# Patient Record
Sex: Female | Born: 1945 | Race: White | Hispanic: No | Marital: Married | Smoking: Current every day smoker
Health system: Southern US, Community
[De-identification: ages and names within clinical notes are randomized; demographics above are authoritative.]

## PROBLEM LIST (undated history)

## (undated) DIAGNOSIS — K219 Gastro-esophageal reflux disease without esophagitis: Secondary | ICD-10-CM

## (undated) DIAGNOSIS — E119 Type 2 diabetes mellitus without complications: Secondary | ICD-10-CM

## (undated) DIAGNOSIS — N189 Chronic kidney disease, unspecified: Secondary | ICD-10-CM

## (undated) HISTORY — PX: TONSILLECTOMY: SUR1361

## (undated) HISTORY — PX: APPENDECTOMY: SHX54

## (undated) HISTORY — PX: ABDOMINAL HYSTERECTOMY: SHX81

## (undated) HISTORY — PX: BACK SURGERY: SHX140

## (undated) HISTORY — PX: BREAST SURGERY: SHX581

## (undated) HISTORY — PX: EYE SURGERY: SHX253

---

## 2015-03-17 ENCOUNTER — Other Ambulatory Visit (HOSPITAL_BASED_OUTPATIENT_CLINIC_OR_DEPARTMENT_OTHER): Payer: Self-pay | Admitting: Neurosurgery

## 2015-03-17 DIAGNOSIS — M5416 Radiculopathy, lumbar region: Secondary | ICD-10-CM

## 2015-03-22 ENCOUNTER — Ambulatory Visit (HOSPITAL_BASED_OUTPATIENT_CLINIC_OR_DEPARTMENT_OTHER)
Admission: RE | Admit: 2015-03-22 | Discharge: 2015-03-22 | Disposition: A | Discharge status: Home / Self Care | Payer: Medicare Other | Source: Ambulatory Visit | Attending: Neurosurgery | Admitting: Neurosurgery

## 2015-03-22 DIAGNOSIS — M5124 Other intervertebral disc displacement, thoracic region: Secondary | ICD-10-CM | POA: Insufficient documentation

## 2015-03-22 DIAGNOSIS — M5116 Intervertebral disc disorders with radiculopathy, lumbar region: Secondary | ICD-10-CM | POA: Diagnosis not present

## 2015-03-22 DIAGNOSIS — M545 Low back pain: Secondary | ICD-10-CM | POA: Diagnosis present

## 2015-03-22 DIAGNOSIS — M4687 Other specified inflammatory spondylopathies, lumbosacral region: Secondary | ICD-10-CM | POA: Diagnosis not present

## 2015-03-22 DIAGNOSIS — M5416 Radiculopathy, lumbar region: Secondary | ICD-10-CM

## 2016-04-28 ENCOUNTER — Other Ambulatory Visit (HOSPITAL_BASED_OUTPATIENT_CLINIC_OR_DEPARTMENT_OTHER): Payer: Self-pay | Admitting: Neurological Surgery

## 2016-04-28 DIAGNOSIS — M5414 Radiculopathy, thoracic region: Secondary | ICD-10-CM

## 2016-05-01 ENCOUNTER — Ambulatory Visit (HOSPITAL_BASED_OUTPATIENT_CLINIC_OR_DEPARTMENT_OTHER)
Admission: RE | Admit: 2016-05-01 | Discharge: 2016-05-01 | Disposition: A | Discharge status: Home / Self Care | Payer: Medicare Other | Source: Ambulatory Visit | Attending: Neurological Surgery | Admitting: Neurological Surgery

## 2016-05-01 DIAGNOSIS — M5124 Other intervertebral disc displacement, thoracic region: Secondary | ICD-10-CM | POA: Diagnosis not present

## 2016-05-01 DIAGNOSIS — M5414 Radiculopathy, thoracic region: Secondary | ICD-10-CM | POA: Insufficient documentation

## 2020-11-28 ENCOUNTER — Other Ambulatory Visit: Payer: Self-pay | Admitting: Neurological Surgery

## 2020-12-17 NOTE — Progress Notes (Signed)
Surgical Instructions    Your procedure is scheduled on 12/22/20.  Report to [facility] Main Entrance "A" at 11:30 A.M., then check in with the Admitting office.  Call this number if you have problems the morning of surgery:  [phone]   If you have any questions prior to your surgery date call [phone]: Open Monday-Friday 8am-4pm    Remember:  Do not eat or drink after midnight the night before your surgery   Take these medicines the morning of surgery with A SIP OF WATER  pravastatin (PRAVACHOL)  If needed: brimonidine-timolol (COMBIGAN) meclizine (ANTIVERT)  As of today, STOP taking any Aspirin (unless otherwise instructed by your surgeon) Aleve, Naproxen, Ibuprofen, Motrin, Advil, Goody's, BC's, all herbal medications, fish oil, and all vitamins.  WHAT DO I DO ABOUT MY DIABETES MEDICATION? Take glipiZIDE (GLUCOTROL XL) only in the morning the day before surgery.  Take metFORMIN (GLUCOPHAGE-XR) usual dose the day before surgery Do not take glipiZIDE (GLUCOTROL XL) or metFORMIN (GLUCOPHAGE-XR) the morning of surgery.      HOW TO MANAGE YOUR DIABETES BEFORE AND AFTER SURGERY  Why is it important to control my blood sugar before and after surgery? Improving blood sugar levels before and after surgery helps healing and can limit problems. A way of improving blood sugar control is eating a healthy diet by:  Eating less sugar and carbohydrates  Increasing activity/exercise  Talking with your doctor about reaching your blood sugar goals High blood sugars (greater than 180 mg/dL) can raise your risk of infections and slow your recovery, so you will need to focus on controlling your diabetes during the weeks before surgery. Make sure that the doctor who takes care of your diabetes knows about your planned surgery including the date and location.  How do I manage my blood sugar before surgery? Check your blood sugar at least 4 times a day, starting 2 days before surgery, to  make sure that the level is not too high or low.  Check your blood sugar the morning of your surgery when you wake up and every 2 hours until you get to the Short Stay unit.  If your blood sugar is less than 70 mg/dL, you will need to treat for low blood sugar: Do not take insulin. Treat a low blood sugar (less than 70 mg/dL) with  cup of clear juice (cranberry or apple), 4 glucose tablets, OR glucose gel. Recheck blood sugar in 15 minutes after treatment (to make sure it is greater than 70 mg/dL). If your blood sugar is not greater than 70 mg/dL on recheck, call [phone] for further instructions. Report your blood sugar to the short stay nurse when you get to Short Stay.  If you are admitted to the hospital after surgery: Your blood sugar will be checked by the staff and you will probably be given insulin after surgery (instead of oral diabetes medicines) to make sure you have good blood sugar levels. The goal for blood sugar control after surgery is 80-180 mg/dL.           Do not wear jewelry or makeup Do not wear lotions, powders, perfumes/colognes, or deodorant. Do not shave 48 hours prior to surgery.  Men may shave face and neck. Do not bring valuables to the hospital. DO Not wear nail polish, gel polish, artificial nails, or any other type of covering on  natural nails including finger and toenails. If patients have artificial nails, gel coating, etc. that need to be removed by a nail  salon please have this removed prior to surgery or surgery may need to be canceled/delayed if the surgeon/ anesthesia feels like the patient is unable to be adequately monitored.             [facility] is not responsible for any belongings or valuables.  Do NOT Smoke (Tobacco/Vaping) or drink Alcohol 24 hours prior to your procedure If you use a CPAP at night, you may bring all equipment for your overnight stay.   Contacts, glasses, dentures or bridgework may not be worn into surgery, please bring  cases for these belongings   For patients admitted to the hospital, discharge time will be determined by your treatment team.   Patients discharged the day of surgery will not be allowed to drive home, and someone needs to stay with them for 24 hours.  ONLY 1 SUPPORT PERSON MAY BE PRESENT WHILE YOU ARE IN SURGERY. IF YOU ARE TO BE ADMITTED ONCE YOU ARE IN YOUR ROOM YOU WILL BE ALLOWED TWO (2) VISITORS.  Minor children may have two parents present. Special consideration for safety and communication needs will be reviewed on a case by case basis.  Special instructions:    Oral Hygiene is also important to reduce your risk of infection.  Remember - BRUSH YOUR TEETH THE MORNING OF SURGERY WITH YOUR REGULAR TOOTHPASTE   [facility]- Preparing For Surgery  Before surgery, you can play an important role. Because skin is not sterile, your skin needs to be as free of germs as possible. You can reduce the number of germs on your skin by washing with CHG (chlorahexidine gluconate) Soap before surgery.  CHG is an antiseptic cleaner which kills germs and bonds with the skin to continue killing germs even after washing.     Please do not use if you have an allergy to CHG or antibacterial soaps. If your skin becomes reddened/irritated stop using the CHG.  Do not shave (including legs and underarms) for at least 48 hours prior to first CHG shower. It is OK to shave your face.  Please follow these instructions carefully.     Shower the NIGHT BEFORE SURGERY and the MORNING OF SURGERY with CHG Soap.   If you chose to wash your hair, wash your hair first as usual with your normal shampoo. After you shampoo, rinse your hair and body thoroughly to remove the shampoo.  Then [employer] and genitals (private parts) with your normal soap and rinse thoroughly to remove soap.  After that Use CHG Soap as you would any other liquid soap. You can apply CHG directly to the skin and wash gently with a scrungie or a clean  washcloth.   Apply the CHG Soap to your body ONLY FROM THE NECK DOWN.  Do not use on open wounds or open sores. Avoid contact with your eyes, ears, mouth and genitals (private parts). Wash Face and genitals (private parts)  with your normal soap.   Wash thoroughly, paying special attention to the area where your surgery will be performed.  Thoroughly rinse your body with warm water from the neck down.  DO NOT shower/wash with your normal soap after using and rinsing off the CHG Soap.  Pat yourself dry with a CLEAN TOWEL.  Wear CLEAN PAJAMAS to bed the night before surgery  Place CLEAN SHEETS on your bed the night before your surgery  DO NOT SLEEP WITH PETS.   Day of Surgery:  Take a shower with CHG soap. Wear Clean/Comfortable clothing  the morning of surgery Do not apply any deodorants/lotions.   Remember to brush your teeth WITH YOUR REGULAR TOOTHPASTE.   Please read over the following fact sheets that you were given.

## 2020-12-17 NOTE — Progress Notes (Signed)
Surgical Instructions    Your procedure is scheduled on 12/22/20.  Report to [facility] Main Entrance "A" at 11:30 A.M., then check in with the Admitting office.  Call this number if you have problems the morning of surgery:  [phone]   If you have any questions prior to your surgery date call [phone]: Open Monday-Friday 8am-4pm    Remember:  Do not eat or drink after midnight the night before your surgery   Take these medicines the morning of surgery with A SIP OF WATER  pravastatin (PRAVACHOL) brimonidine-timolol (COMBIGAN)   If needed: meclizine (ANTIVERT) oratadine-pseudoephedrine (CLARITIN-D 12-HOUR)   As of today, STOP taking any Aspirin (unless otherwise instructed by your surgeon) Aleve, Naproxen, Ibuprofen, Motrin, Advil, Goody's, BC's, all herbal medications, fish oil, and all vitamins.  WHAT DO I DO ABOUT MY DIABETES MEDICATION? Do not take glipiZIDE (GLUCOTROL XL) or metFORMIN (GLUCOPHAGE-XR) the morning of surgery.      HOW TO MANAGE YOUR DIABETES BEFORE AND AFTER SURGERY  Why is it important to control my blood sugar before and after surgery? Improving blood sugar levels before and after surgery helps healing and can limit problems. A way of improving blood sugar control is eating a healthy diet by:  Eating less sugar and carbohydrates  Increasing activity/exercise  Talking with your doctor about reaching your blood sugar goals High blood sugars (greater than 180 mg/dL) can raise your risk of infections and slow your recovery, so you will need to focus on controlling your diabetes during the weeks before surgery. Make sure that the doctor who takes care of your diabetes knows about your planned surgery including the date and location.  How do I manage my blood sugar before surgery? Check your blood sugar at least 4 times a day, starting 2 days before surgery, to make sure that the level is not too high or low.  Check your blood sugar the morning of  your surgery when you wake up and every 2 hours until you get to the Short Stay unit.  If your blood sugar is less than 70 mg/dL, you will need to treat for low blood sugar: Do not take insulin. Treat a low blood sugar (less than 70 mg/dL) with  cup of clear juice (cranberry or apple), 4 glucose tablets, OR glucose gel. Recheck blood sugar in 15 minutes after treatment (to make sure it is greater than 70 mg/dL). If your blood sugar is not greater than 70 mg/dL on recheck, call [phone] for further instructions. Report your blood sugar to the short stay nurse when you get to Short Stay.  If you are admitted to the hospital after surgery: Your blood sugar will be checked by the staff and you will probably be given insulin after surgery (instead of oral diabetes medicines) to make sure you have good blood sugar levels. The goal for blood sugar control after surgery is 80-180 mg/dL.           Do not wear jewelry or makeup Do not wear lotions, powders, perfumes/colognes, or deodorant. Do not shave 48 hours prior to surgery.  Men may shave face and neck. Do not bring valuables to the hospital. DO Not wear nail polish, gel polish, artificial nails, or any other type of covering on  natural nails including finger and toenails. If patients have artificial nails, gel coating, etc. that need to be removed by a nail salon please have this removed prior to surgery or surgery may need to be canceled/delayed if the  surgeon/ anesthesia feels like the patient is unable to be adequately monitored.             [facility] is not responsible for any belongings or valuables.  Do NOT Smoke (Tobacco/Vaping) or drink Alcohol 24 hours prior to your procedure If you use a CPAP at night, you may bring all equipment for your overnight stay.   Contacts, glasses, dentures or bridgework may not be worn into surgery, please bring cases for these belongings   For patients admitted to the hospital, discharge time will  be determined by your treatment team.   Patients discharged the day of surgery will not be allowed to drive home, and someone needs to stay with them for 24 hours.  ONLY 1 SUPPORT PERSON MAY BE PRESENT WHILE YOU ARE IN SURGERY. IF YOU ARE TO BE ADMITTED ONCE YOU ARE IN YOUR ROOM YOU WILL BE ALLOWED TWO (2) VISITORS.  Minor children may have two parents present. Special consideration for safety and communication needs will be reviewed on a case by case basis.  Special instructions:    Oral Hygiene is also important to reduce your risk of infection.  Remember - BRUSH YOUR TEETH THE MORNING OF SURGERY WITH YOUR REGULAR TOOTHPASTE   [facility]- Preparing For Surgery  Before surgery, you can play an important role. Because skin is not sterile, your skin needs to be as free of germs as possible. You can reduce the number of germs on your skin by washing with CHG (chlorahexidine gluconate) Soap before surgery.  CHG is an antiseptic cleaner which kills germs and bonds with the skin to continue killing germs even after washing.     Please do not use if you have an allergy to CHG or antibacterial soaps. If your skin becomes reddened/irritated stop using the CHG.  Do not shave (including legs and underarms) for at least 48 hours prior to first CHG shower. It is OK to shave your face.  Please follow these instructions carefully.     Shower the NIGHT BEFORE SURGERY and the MORNING OF SURGERY with CHG Soap.   If you chose to wash your hair, wash your hair first as usual with your normal shampoo. After you shampoo, rinse your hair and body thoroughly to remove the shampoo.  Then [employer] and genitals (private parts) with your normal soap and rinse thoroughly to remove soap.  After that Use CHG Soap as you would any other liquid soap. You can apply CHG directly to the skin and wash gently with a scrungie or a clean washcloth.   Apply the CHG Soap to your body ONLY FROM THE NECK DOWN.  Do not use on open  wounds or open sores. Avoid contact with your eyes, ears, mouth and genitals (private parts). Wash Face and genitals (private parts)  with your normal soap.   Wash thoroughly, paying special attention to the area where your surgery will be performed.  Thoroughly rinse your body with warm water from the neck down.  DO NOT shower/wash with your normal soap after using and rinsing off the CHG Soap.  Pat yourself dry with a CLEAN TOWEL.  Wear CLEAN PAJAMAS to bed the night before surgery  Place CLEAN SHEETS on your bed the night before your surgery  DO NOT SLEEP WITH PETS.   Day of Surgery: Take a shower with CHG soap. Wear Clean/Comfortable clothing the morning of surgery Do not apply any deodorants/lotions.   Remember to brush your teeth WITH YOUR  REGULAR TOOTHPASTE.   Please read over the following fact sheets that you were given.

## 2020-12-18 ENCOUNTER — Encounter (HOSPITAL_COMMUNITY)
Admission: RE | Admit: 2020-12-18 | Discharge: 2020-12-18 | Disposition: A | Discharge status: Home / Self Care | Payer: Medicare Other | Source: Ambulatory Visit | Attending: Neurological Surgery | Admitting: Neurological Surgery

## 2020-12-18 ENCOUNTER — Encounter (HOSPITAL_COMMUNITY): Payer: Self-pay

## 2020-12-18 ENCOUNTER — Other Ambulatory Visit: Payer: Self-pay

## 2020-12-18 DIAGNOSIS — Z20822 Contact with and (suspected) exposure to covid-19: Secondary | ICD-10-CM | POA: Diagnosis not present

## 2020-12-18 DIAGNOSIS — Z01812 Encounter for preprocedural laboratory examination: Secondary | ICD-10-CM | POA: Insufficient documentation

## 2020-12-18 HISTORY — DX: Type 2 diabetes mellitus without complications: E11.9

## 2020-12-18 HISTORY — DX: Chronic kidney disease, unspecified: N18.9

## 2020-12-18 HISTORY — DX: Gastro-esophageal reflux disease without esophagitis: K21.9

## 2020-12-18 LAB — TYPE AND SCREEN
ABO/RH(D): A POS
Antibody Screen: NEGATIVE

## 2020-12-18 LAB — CBC
HCT: 43.9 % (ref 36.0–46.0)
Hemoglobin: 14.5 g/dL (ref 12.0–15.0)
MCH: 30 pg (ref 26.0–34.0)
MCHC: 33 g/dL (ref 30.0–36.0)
MCV: 90.7 fL (ref 80.0–100.0)
Platelets: 357 10*3/uL (ref 150–400)
RBC: 4.84 MIL/uL (ref 3.87–5.11)
RDW: 14.7 % (ref 11.5–15.5)
WBC: 12.3 10*3/uL — ABNORMAL HIGH (ref 4.0–10.5)
nRBC: 0 % (ref 0.0–0.2)

## 2020-12-18 LAB — SURGICAL PCR SCREEN
MRSA, PCR: NEGATIVE
Staphylococcus aureus: NEGATIVE

## 2020-12-18 LAB — BASIC METABOLIC PANEL
Anion gap: 9 (ref 5–15)
BUN: 12 mg/dL (ref 8–23)
CO2: 24 mmol/L (ref 22–32)
Calcium: 9.6 mg/dL (ref 8.9–10.3)
Chloride: 105 mmol/L (ref 98–111)
Creatinine, Ser: 0.55 mg/dL (ref 0.44–1.00)
GFR, Estimated: >60 mL/min (ref >60)
Glucose, Bld: 86 mg/dL (ref 70–99)
Potassium: 3.7 mmol/L (ref 3.5–5.1)
Sodium: 138 mmol/L (ref 135–145)

## 2020-12-18 LAB — SARS CORONAVIRUS 2 (TAT 6-24 HRS): SARS Coronavirus 2: NEGATIVE

## 2020-12-18 LAB — HEMOGLOBIN A1C
Hgb A1c MFr Bld: 7.5 % — ABNORMAL HIGH (ref 4.8–5.6)
Mean Plasma Glucose: 168.55 mg/dL

## 2020-12-18 LAB — GLUCOSE, CAPILLARY: Glucose-Capillary: 88 mg/dL (ref 70–99)

## 2020-12-18 NOTE — Progress Notes (Signed)
PCP - Elijah Birk, DO Cardiologist - denies  PPM/ICD - denies Device Orders - N/A Rep Notified - N/A  Chest x-ray - N/A EKG - 12/29/2019 (EKG tracing requested) Stress Test - denies ECHO - denies Cardiac Cath - denies  Sleep Study - denies CPAP - N/A  Fasting Blood Sugar - 106/119 Checks Blood Sugar 2 times a day CBG today - 88 A1C done in PAT on 12/18/2020  Blood Thinner Instructions: N/A Aspirin Instructions: Aspirin - last dose - 12/14/2020  Patient was instructed: As of today, STOP taking any Aspirin (unless otherwise instructed by your surgeon) Aleve, Naproxen, Ibuprofen, Motrin, Advil, Goody's, BC's, all herbal medications, fish oil, and all vitamins.  ERAS Protcol - No PRE-SURGERY Ensure or G2- N/A  COVID TEST- 12/18/2020   Anesthesia review: yes, EKG tracing requested in PAT  Patient denies shortness of breath, fever, cough and chest pain at PAT appointment   All instructions explained to the patient, with a verbal understanding of the material. Patient agrees to go over the instructions while at home for a better understanding. Patient also instructed to self quarantine after being tested for COVID-19. The opportunity to ask questions was provided.

## 2020-12-18 NOTE — Progress Notes (Signed)
Dr. Marikay Alar was notified that this patient is coming for PAT appointment on 12/18/2020 @ 3 PM and pre-op orders were not available.

## 2020-12-22 ENCOUNTER — Encounter (HOSPITAL_COMMUNITY): Payer: Self-pay | Admitting: Neurological Surgery

## 2020-12-22 ENCOUNTER — Other Ambulatory Visit: Payer: Self-pay

## 2020-12-22 ENCOUNTER — Ambulatory Visit (HOSPITAL_COMMUNITY): Payer: Medicare Other | Admitting: Physician Assistant

## 2020-12-22 ENCOUNTER — Observation Stay (HOSPITAL_COMMUNITY)
Admission: RE | Admit: 2020-12-22 | Discharge: 2020-12-24 | Disposition: A | Discharge status: Home / Self Care | Payer: Medicare Other | Source: Ambulatory Visit | Attending: Neurological Surgery | Admitting: Neurological Surgery

## 2020-12-22 ENCOUNTER — Encounter (HOSPITAL_COMMUNITY)
Admission: RE | Disposition: A | Discharge status: Home / Self Care | Payer: Self-pay | Source: Ambulatory Visit | Attending: Neurological Surgery

## 2020-12-22 ENCOUNTER — Ambulatory Visit (HOSPITAL_COMMUNITY): Payer: Medicare Other

## 2020-12-22 ENCOUNTER — Ambulatory Visit (HOSPITAL_COMMUNITY): Payer: Medicare Other | Admitting: Anesthesiology

## 2020-12-22 DIAGNOSIS — Z91040 Latex allergy status: Secondary | ICD-10-CM | POA: Diagnosis not present

## 2020-12-22 DIAGNOSIS — Z79899 Other long term (current) drug therapy: Secondary | ICD-10-CM | POA: Diagnosis not present

## 2020-12-22 DIAGNOSIS — Z7982 Long term (current) use of aspirin: Secondary | ICD-10-CM | POA: Diagnosis not present

## 2020-12-22 DIAGNOSIS — F17210 Nicotine dependence, cigarettes, uncomplicated: Secondary | ICD-10-CM | POA: Diagnosis not present

## 2020-12-22 DIAGNOSIS — E1122 Type 2 diabetes mellitus with diabetic chronic kidney disease: Secondary | ICD-10-CM | POA: Diagnosis not present

## 2020-12-22 DIAGNOSIS — M5116 Intervertebral disc disorders with radiculopathy, lumbar region: Secondary | ICD-10-CM | POA: Diagnosis not present

## 2020-12-22 DIAGNOSIS — N189 Chronic kidney disease, unspecified: Secondary | ICD-10-CM | POA: Insufficient documentation

## 2020-12-22 DIAGNOSIS — Z7984 Long term (current) use of oral hypoglycemic drugs: Secondary | ICD-10-CM | POA: Diagnosis not present

## 2020-12-22 DIAGNOSIS — Z981 Arthrodesis status: Secondary | ICD-10-CM

## 2020-12-22 DIAGNOSIS — M5126 Other intervertebral disc displacement, lumbar region: Secondary | ICD-10-CM | POA: Diagnosis present

## 2020-12-22 DIAGNOSIS — Z419 Encounter for procedure for purposes other than remedying health state, unspecified: Secondary | ICD-10-CM

## 2020-12-22 LAB — GLUCOSE, CAPILLARY
Glucose-Capillary: 127 mg/dL — ABNORMAL HIGH (ref 70–99)
Glucose-Capillary: 104 mg/dL — ABNORMAL HIGH (ref 70–99)
Glucose-Capillary: 332 mg/dL — ABNORMAL HIGH (ref 70–99)

## 2020-12-22 LAB — ABO/RH: ABO/RH(D): A POS

## 2020-12-22 SURGERY — POSTERIOR LUMBAR FUSION 1 LEVEL
Anesthesia: General | Site: Back

## 2020-12-22 MED ORDER — METHOCARBAMOL 500 MG PO TABS
500.0000 mg | ORAL_TABLET | Freq: Four times a day (QID) | ORAL | Status: DC | PRN
  Administered 2020-12-22 – 2020-12-24 (×5): 500 mg via ORAL
  Filled 2020-12-22 (×4): qty 1

## 2020-12-22 MED ORDER — DIPHENHYDRAMINE HCL 50 MG/ML IJ SOLN
INTRAMUSCULAR | Status: DC | PRN
  Administered 2020-12-22: 12.5 mg via INTRAVENOUS

## 2020-12-22 MED ORDER — THROMBIN 5000 UNITS EX SOLR
OROMUCOSAL | Status: DC | PRN
  Administered 2020-12-22: 5 mL via TOPICAL

## 2020-12-22 MED ORDER — THROMBIN 20000 UNITS EX SOLR
CUTANEOUS | Status: DC | PRN
  Administered 2020-12-22: 20 mL via TOPICAL

## 2020-12-22 MED ORDER — SODIUM CHLORIDE 0.9 % IV SOLN
250.0000 mL | INTRAVENOUS | Status: DC
  Administered 2020-12-22: 250 mL via INTRAVENOUS

## 2020-12-22 MED ORDER — ONDANSETRON HCL 4 MG/2ML IJ SOLN: 4.0000 mg | Freq: Once | INTRAMUSCULAR | Status: DC | PRN

## 2020-12-22 MED ORDER — OXYCODONE HCL 5 MG PO TABS
10.0000 mg | ORAL_TABLET | ORAL | Status: DC | PRN
  Administered 2020-12-22 – 2020-12-23 (×3): 10 mg via ORAL
  Filled 2020-12-22 (×2): qty 2

## 2020-12-22 MED ORDER — INSULIN ASPART 100 UNIT/ML IJ SOLN
0.0000 [IU] | Freq: Three times a day (TID) | INTRAMUSCULAR | Status: DC
  Administered 2020-12-23 (×2): 3 [IU] via SUBCUTANEOUS
  Administered 2020-12-24: 2 [IU] via SUBCUTANEOUS

## 2020-12-22 MED ORDER — INSULIN ASPART 100 UNIT/ML IJ SOLN
0.0000 [IU] | Freq: Three times a day (TID) | INTRAMUSCULAR | Status: DC
Start: 2020-12-23 — End: 2020-12-22

## 2020-12-22 MED ORDER — PHENOL 1.4 % MT LIQD: 1.0000 | OROMUCOSAL | Status: DC | PRN

## 2020-12-22 MED ORDER — METFORMIN HCL ER 500 MG PO TB24
500.0000 mg | ORAL_TABLET | Freq: Two times a day (BID) | ORAL | Status: DC
  Administered 2020-12-22 – 2020-12-24 (×4): 500 mg via ORAL
  Filled 2020-12-22 (×4): qty 1

## 2020-12-22 MED ORDER — MENTHOL 3 MG MT LOZG: 1.0000 | LOZENGE | OROMUCOSAL | Status: DC | PRN

## 2020-12-22 MED ORDER — THROMBIN 5000 UNITS EX SOLR
CUTANEOUS | Status: AC
  Filled 2020-12-22: qty 5000

## 2020-12-22 MED ORDER — POTASSIUM CHLORIDE IN NACL 20-0.9 MEQ/L-% IV SOLN: INTRAVENOUS | Status: DC

## 2020-12-22 MED ORDER — ONDANSETRON HCL 4 MG/2ML IJ SOLN: 4.0000 mg | Freq: Four times a day (QID) | INTRAMUSCULAR | Status: DC | PRN

## 2020-12-22 MED ORDER — ONDANSETRON HCL 4 MG PO TABS: 4.0000 mg | ORAL_TABLET | Freq: Four times a day (QID) | ORAL | Status: DC | PRN

## 2020-12-22 MED ORDER — ACETAMINOPHEN 500 MG PO TABS
1000.0000 mg | ORAL_TABLET | Freq: Once | ORAL | Status: AC
  Administered 2020-12-22: 1000 mg via ORAL
  Filled 2020-12-22: qty 2

## 2020-12-22 MED ORDER — FENTANYL CITRATE (PF) 100 MCG/2ML IJ SOLN
INTRAMUSCULAR | Status: AC
  Filled 2020-12-22: qty 2

## 2020-12-22 MED ORDER — SENNA 8.6 MG PO TABS
1.0000 | ORAL_TABLET | Freq: Two times a day (BID) | ORAL | Status: DC
  Administered 2020-12-22 – 2020-12-24 (×4): 8.6 mg via ORAL
  Filled 2020-12-22 (×4): qty 1

## 2020-12-22 MED ORDER — LACTATED RINGERS IV SOLN: INTRAVENOUS | Status: DC

## 2020-12-22 MED ORDER — SODIUM CHLORIDE 0.9% FLUSH: 3.0000 mL | INTRAVENOUS | Status: DC | PRN

## 2020-12-22 MED ORDER — FENTANYL CITRATE (PF) 250 MCG/5ML IJ SOLN
INTRAMUSCULAR | Status: AC
  Filled 2020-12-22: qty 5

## 2020-12-22 MED ORDER — TIMOLOL MALEATE 0.5 % OP SOLN
1.0000 [drp] | Freq: Two times a day (BID) | OPHTHALMIC | Status: DC
  Administered 2020-12-22 – 2020-12-24 (×4): 1 [drp] via OPHTHALMIC
  Filled 2020-12-22: qty 5

## 2020-12-22 MED ORDER — LIDOCAINE 2% (20 MG/ML) 5 ML SYRINGE
INTRAMUSCULAR | Status: DC | PRN
  Administered 2020-12-22: 60 mg via INTRAVENOUS

## 2020-12-22 MED ORDER — OXYCODONE HCL 5 MG PO TABS
ORAL_TABLET | ORAL | Status: AC
  Filled 2020-12-22: qty 2

## 2020-12-22 MED ORDER — VITAMIN D 25 MCG (1000 UNIT) PO TABS
1000.0000 [IU] | ORAL_TABLET | Freq: Every day | ORAL | Status: DC
  Administered 2020-12-23 – 2020-12-24 (×2): 1000 [IU] via ORAL
  Filled 2020-12-22 (×2): qty 1

## 2020-12-22 MED ORDER — ASPIRIN EC 81 MG PO TBEC
81.0000 mg | DELAYED_RELEASE_TABLET | Freq: Every day | ORAL | Status: DC
  Administered 2020-12-23 – 2020-12-24 (×2): 81 mg via ORAL
  Filled 2020-12-22 (×2): qty 1

## 2020-12-22 MED ORDER — GLIPIZIDE ER 10 MG PO TB24
20.0000 mg | ORAL_TABLET | Freq: Every day | ORAL | Status: DC
  Administered 2020-12-23 – 2020-12-24 (×2): 20 mg via ORAL
  Filled 2020-12-22 (×2): qty 2

## 2020-12-22 MED ORDER — CEFAZOLIN SODIUM-DEXTROSE 2-4 GM/100ML-% IV SOLN
2.0000 g | Freq: Three times a day (TID) | INTRAVENOUS | Status: AC
  Administered 2020-12-22 – 2020-12-23 (×2): 2 g via INTRAVENOUS
  Filled 2020-12-22 (×2): qty 100

## 2020-12-22 MED ORDER — CHOLECALCIFEROL 10 MCG (400 UNIT) PO TABS
400.0000 [IU] | ORAL_TABLET | Freq: Every day | ORAL | Status: DC
  Administered 2020-12-23 – 2020-12-24 (×2): 400 [IU] via ORAL
  Filled 2020-12-22 (×2): qty 1

## 2020-12-22 MED ORDER — 0.9 % SODIUM CHLORIDE (POUR BTL) OPTIME
TOPICAL | Status: DC | PRN
  Administered 2020-12-22: 1000 mL

## 2020-12-22 MED ORDER — ACETAMINOPHEN 650 MG RE SUPP: 650.0000 mg | RECTAL | Status: DC | PRN

## 2020-12-22 MED ORDER — ONDANSETRON HCL 4 MG/2ML IJ SOLN
INTRAMUSCULAR | Status: DC | PRN
  Administered 2020-12-22: 4 mg via INTRAVENOUS

## 2020-12-22 MED ORDER — FENTANYL CITRATE (PF) 100 MCG/2ML IJ SOLN
25.0000 ug | INTRAMUSCULAR | Status: DC | PRN
  Administered 2020-12-22 (×3): 50 ug via INTRAVENOUS

## 2020-12-22 MED ORDER — CELECOXIB 200 MG PO CAPS
200.0000 mg | ORAL_CAPSULE | Freq: Two times a day (BID) | ORAL | Status: DC
  Administered 2020-12-22 – 2020-12-24 (×4): 200 mg via ORAL
  Filled 2020-12-22 (×4): qty 1

## 2020-12-22 MED ORDER — CHLORHEXIDINE GLUCONATE 0.12 % MT SOLN
15.0000 mL | Freq: Once | OROMUCOSAL | Status: AC
  Administered 2020-12-22: 15 mL via OROMUCOSAL
  Filled 2020-12-22: qty 15

## 2020-12-22 MED ORDER — MORPHINE SULFATE (PF) 2 MG/ML IV SOLN: 2.0000 mg | INTRAVENOUS | Status: DC | PRN

## 2020-12-22 MED ORDER — CEFAZOLIN SODIUM-DEXTROSE 2-3 GM-%(50ML) IV SOLR
INTRAVENOUS | Status: DC | PRN
  Administered 2020-12-22: 2 g via INTRAVENOUS

## 2020-12-22 MED ORDER — ESTRADIOL 0.05 MG/24HR TD PTWK: 0.0500 mg | MEDICATED_PATCH | TRANSDERMAL | Status: DC

## 2020-12-22 MED ORDER — THROMBIN 20000 UNITS EX SOLR
CUTANEOUS | Status: AC
  Filled 2020-12-22: qty 20000

## 2020-12-22 MED ORDER — ROCURONIUM BROMIDE 10 MG/ML (PF) SYRINGE
PREFILLED_SYRINGE | INTRAVENOUS | Status: DC | PRN
  Administered 2020-12-22 (×2): 10 mg via INTRAVENOUS
  Administered 2020-12-22: 60 mg via INTRAVENOUS

## 2020-12-22 MED ORDER — ACETAMINOPHEN 325 MG PO TABS
650.0000 mg | ORAL_TABLET | ORAL | Status: DC | PRN
  Administered 2020-12-22 – 2020-12-23 (×3): 650 mg via ORAL
  Filled 2020-12-22 (×3): qty 2

## 2020-12-22 MED ORDER — BUPIVACAINE HCL (PF) 0.25 % IJ SOLN
INTRAMUSCULAR | Status: DC | PRN
  Administered 2020-12-22: 5 mL
  Administered 2020-12-22: 10 mL

## 2020-12-22 MED ORDER — BUPIVACAINE HCL (PF) 0.25 % IJ SOLN
INTRAMUSCULAR | Status: AC
  Filled 2020-12-22: qty 30

## 2020-12-22 MED ORDER — DEXAMETHASONE SODIUM PHOSPHATE 10 MG/ML IJ SOLN
INTRAMUSCULAR | Status: DC | PRN
  Administered 2020-12-22: 10 mg via INTRAVENOUS

## 2020-12-22 MED ORDER — PHENYLEPHRINE HCL-NACL 10-0.9 MG/250ML-% IV SOLN
INTRAVENOUS | Status: DC | PRN
  Administered 2020-12-22: 30 ug/min via INTRAVENOUS

## 2020-12-22 MED ORDER — INSULIN ASPART 100 UNIT/ML IJ SOLN
0.0000 [IU] | Freq: Every day | INTRAMUSCULAR | Status: DC
  Administered 2020-12-22: 4 [IU] via SUBCUTANEOUS

## 2020-12-22 MED ORDER — METHOCARBAMOL 500 MG PO TABS
ORAL_TABLET | ORAL | Status: AC
  Filled 2020-12-22: qty 1

## 2020-12-22 MED ORDER — SUGAMMADEX SODIUM 200 MG/2ML IV SOLN
INTRAVENOUS | Status: DC | PRN
  Administered 2020-12-22: 140 mg via INTRAVENOUS

## 2020-12-22 MED ORDER — METHOCARBAMOL 1000 MG/10ML IJ SOLN
500.0000 mg | Freq: Four times a day (QID) | INTRAVENOUS | Status: DC | PRN
  Filled 2020-12-22: qty 5

## 2020-12-22 MED ORDER — BRIMONIDINE TARTRATE 0.2 % OP SOLN
1.0000 [drp] | Freq: Two times a day (BID) | OPHTHALMIC | Status: DC
  Administered 2020-12-22 – 2020-12-24 (×4): 1 [drp] via OPHTHALMIC
  Filled 2020-12-22: qty 5

## 2020-12-22 MED ORDER — FENTANYL CITRATE (PF) 250 MCG/5ML IJ SOLN
INTRAMUSCULAR | Status: DC | PRN
  Administered 2020-12-22: 100 ug via INTRAVENOUS
  Administered 2020-12-22 (×3): 50 ug via INTRAVENOUS

## 2020-12-22 MED ORDER — SODIUM CHLORIDE 0.9% FLUSH
3.0000 mL | Freq: Two times a day (BID) | INTRAVENOUS | Status: DC
  Administered 2020-12-23: 3 mL via INTRAVENOUS

## 2020-12-22 MED ORDER — LATANOPROST 0.005 % OP SOLN
1.0000 [drp] | Freq: Every day | OPHTHALMIC | Status: DC
  Administered 2020-12-22 – 2020-12-23 (×2): 1 [drp] via OPHTHALMIC
  Filled 2020-12-22: qty 2.5

## 2020-12-22 MED ORDER — ORAL CARE MOUTH RINSE: 15.0000 mL | Freq: Once | OROMUCOSAL | Status: AC

## 2020-12-22 MED ORDER — PROPOFOL 10 MG/ML IV BOLUS
INTRAVENOUS | Status: DC | PRN
  Administered 2020-12-22: 100 mg via INTRAVENOUS

## 2020-12-22 MED ORDER — BRIMONIDINE TARTRATE-TIMOLOL 0.2-0.5 % OP SOLN: 1.0000 [drp] | Freq: Two times a day (BID) | OPHTHALMIC | Status: DC

## 2020-12-22 SURGICAL SUPPLY — 61 items
BAG COUNTER SPONGE SURGICOUNT (BAG) ×4 IMPLANT
BAG SURGICOUNT SPONGE COUNTING (BAG) ×2
BASKET BONE COLLECTION (BASKET) ×3 IMPLANT
BENZOIN TINCTURE PRP APPL 2/3 (GAUZE/BANDAGES/DRESSINGS) ×3 IMPLANT
BLADE CLIPPER SURG (BLADE) IMPLANT
BUR CARBIDE MATCH 3.0 (BURR) ×3 IMPLANT
CANISTER SUCT 3000ML PPV (MISCELLANEOUS) ×3 IMPLANT
CLOSURE WOUND 1/2 X4 (GAUZE/BANDAGES/DRESSINGS) ×1
CNTNR URN SCR LID CUP LEK RST (MISCELLANEOUS) ×1 IMPLANT
CONT SPEC 4OZ STRL OR WHT (MISCELLANEOUS) ×2
COVER BACK TABLE 60X90IN (DRAPES) ×3 IMPLANT
DERMABOND ADVANCED (GAUZE/BANDAGES/DRESSINGS) ×2
DERMABOND ADVANCED .7 DNX12 (GAUZE/BANDAGES/DRESSINGS) ×1 IMPLANT
DRAPE C-ARM 42X72 X-RAY (DRAPES) ×6 IMPLANT
DRAPE LAPAROTOMY 100X72X124 (DRAPES) ×3 IMPLANT
DRAPE SURG 17X23 STRL (DRAPES) ×3 IMPLANT
DRSG OPSITE POSTOP 4X6 (GAUZE/BANDAGES/DRESSINGS) ×3 IMPLANT
DURAPREP 26ML APPLICATOR (WOUND CARE) ×3 IMPLANT
ELECT REM PT RETURN 9FT ADLT (ELECTROSURGICAL) ×3
ELECTRODE REM PT RTRN 9FT ADLT (ELECTROSURGICAL) ×1 IMPLANT
EVACUATOR 1/8 PVC DRAIN (DRAIN) IMPLANT
FIBER BONE ALLOSYNC EXPAND 5 (Bone Implant) ×3 IMPLANT
GAUZE 4X4 16PLY ~~LOC~~+RFID DBL (SPONGE) ×3 IMPLANT
GLOVE SURG ENC MOIS LTX SZ7 (GLOVE) IMPLANT
GLOVE SURG ENC MOIS LTX SZ8 (GLOVE) ×6 IMPLANT
GLOVE SURG UNDER POLY LF SZ7 (GLOVE) IMPLANT
GOWN STRL REUS W/ TWL LRG LVL3 (GOWN DISPOSABLE) IMPLANT
GOWN STRL REUS W/ TWL XL LVL3 (GOWN DISPOSABLE) ×2 IMPLANT
GOWN STRL REUS W/TWL 2XL LVL3 (GOWN DISPOSABLE) IMPLANT
GOWN STRL REUS W/TWL LRG LVL3 (GOWN DISPOSABLE)
GOWN STRL REUS W/TWL XL LVL3 (GOWN DISPOSABLE) ×4
HEMOSTAT POWDER KIT SURGIFOAM (HEMOSTASIS) ×3 IMPLANT
KIT BASIN OR (CUSTOM PROCEDURE TRAY) ×3 IMPLANT
KIT BONE MRW ASP ANGEL CPRP (KITS) IMPLANT
KIT TURNOVER KIT B (KITS) ×3 IMPLANT
MILL MEDIUM DISP (BLADE) IMPLANT
NEEDLE HYPO 25X1 1.5 SAFETY (NEEDLE) ×3 IMPLANT
NS IRRIG 1000ML POUR BTL (IV SOLUTION) ×3 IMPLANT
PACK LAMINECTOMY NEURO (CUSTOM PROCEDURE TRAY) ×3 IMPLANT
PAD ARMBOARD 7.5X6 YLW CONV (MISCELLANEOUS) ×9 IMPLANT
ROD LORD LIPPED TI 5.5X40 (Rod) ×6 IMPLANT
SCREW CORT SHANK MOD 6.5X40 (Screw) ×6 IMPLANT
SCREW MOD INVICTUS 5.5X40 (Screw) ×3 IMPLANT
SCREW MOD INVICTUS 6.5X40 (Screw) ×3 IMPLANT
SCREW POLYAXIAL TULIP (Screw) ×12 IMPLANT
SET SCREW (Screw) ×8 IMPLANT
SET SCREW SPNE (Screw) ×4 IMPLANT
SPACER PS POROUS 8X9X25 10D (Spacer) ×6 IMPLANT
SPONGE SURGIFOAM ABS GEL 100 (HEMOSTASIS) ×3 IMPLANT
SPONGE T-LAP 4X18 ~~LOC~~+RFID (SPONGE) ×6 IMPLANT
STRIP CLOSURE SKIN 1/2X4 (GAUZE/BANDAGES/DRESSINGS) ×2 IMPLANT
SUT VIC AB 0 CT1 18XCR BRD8 (SUTURE) ×1 IMPLANT
SUT VIC AB 0 CT1 8-18 (SUTURE) ×2
SUT VIC AB 2-0 CP2 18 (SUTURE) ×3 IMPLANT
SUT VIC AB 3-0 SH 8-18 (SUTURE) ×6 IMPLANT
SYR 10ML LL (SYRINGE) ×3 IMPLANT
SYR CONTROL 10ML LL (SYRINGE) ×3 IMPLANT
TOWEL GREEN STERILE (TOWEL DISPOSABLE) ×3 IMPLANT
TOWEL GREEN STERILE FF (TOWEL DISPOSABLE) ×3 IMPLANT
TRAY FOLEY MTR SLVR 16FR STAT (SET/KITS/TRAYS/PACK) ×3 IMPLANT
WATER STERILE IRR 1000ML POUR (IV SOLUTION) ×3 IMPLANT

## 2020-12-22 NOTE — H&P (Signed)
Subjective: Patient is a 75 y.o. female admitted for back and leg pain. Onset of symptoms was several months ago, gradually worsening since that time.  The pain is rated severe, and is located at the across the lower back and radiates to lower extremities. The pain is described as aching and occurs all day. The symptoms have been progressive. Symptoms are exacerbated by exercise and standing. 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 Authorizing Provider  aspirin EC 81 MG tablet Take 81 mg by mouth daily. Swallow whole.   Yes [provider]  brimonidine-timolol (COMBIGAN) 0.2-0.5 % ophthalmic solution Apply 1 drop to eye 2 (two) times daily. 11/06/20  Yes [provider]  Cholecalciferol (VITAMIN D3) 10 MCG (400 UNIT) tablet Take 400 Units by mouth daily.   Yes [provider]  cholecalciferol (VITAMIN D3) 25 MCG (1000 UNIT) tablet Take 1,000 Units by mouth daily.   Yes [provider]  estradiol (CLIMARA - DOSED IN MG/24 HR) 0.05 mg/24hr patch Place 0.05 mg onto the skin once a week. 11/18/20  Yes [provider]  glipiZIDE (GLUCOTROL XL) 10 MG 24 hr tablet Take 20 mg by mouth every morning. 12/03/20  Yes [provider]  loratadine-pseudoephedrine (CLARITIN-D 12-HOUR) 5-120 MG tablet Take 1 tablet by mouth daily as needed for allergies.   Yes [provider]  LUMIGAN 0.01 % SOLN Place 1 drop into both eyes at bedtime. 11/19/20  Yes [provider]  metFORMIN (GLUCOPHAGE-XR) 500 MG 24 hr tablet Take 500 mg by mouth 2 (two)  times daily. 09/21/20  Yes [provider]  pravastatin (PRAVACHOL) 80 MG tablet Take 80 mg by mouth daily. 10/25/20  Yes [provider]  vitamin B-12 (CYANOCOBALAMIN) 1000 MCG tablet Take 1,000 mcg by mouth daily.   Yes [provider]  meclizine (ANTIVERT) 25 MG tablet Take 25 mg by mouth 3 (three) times daily as needed (inner ear problems).    [provider]   Allergies  Allergen Reactions   Latex Itching   Levaquin [Levofloxacin] Rash    Made skin burn    Social History   Tobacco Use   Smoking status: Every Day    Packs/day: 3.00    Pack years: 0.00    Types: Cigarettes   Smokeless tobacco: Never  Substance Use Topics   Alcohol use: Not Currently    History reviewed. No pertinent family history.   Review of Systems  Positive ROS: Negative  All other systems have been reviewed and were otherwise negative with the exception of those mentioned in the HPI and as above.  Objective: Vital signs in last 24 hours: Temp:  [97.4 F (36.3 C)] 97.4 F (36.3 C) (07/11 1136) Pulse Rate:  [66] 66 (07/11 1136) Resp:  [17] 17 (07/11 1136) BP: (123)/(58) 123/58 (07/11 1136) SpO2:  [96 %] 96 % (07/11 1136) Weight:  [66.2 kg] 66.2 kg (07/11 1136)  General Appearance: Alert, cooperative, no distress, appears stated age Head: Normocephalic,  without obvious abnormality, atraumatic Eyes: PERRL, conjunctiva/corneas clear, EOM's intact    Neck: Supple, symmetrical, trachea midline Back: Symmetric, no curvature, ROM normal, no CVA tenderness Lungs:  respirations unlabored Heart: Regular rate and rhythm Abdomen: Soft, non-tender Extremities: Extremities normal, atraumatic, no cyanosis or edema Pulses: 2+ and symmetric all extremities Skin: Skin color, texture, turgor normal, no rashes or lesions  NEUROLOGIC:   Mental status: Alert and oriented x4,  no aphasia, good attention span, fund of knowledge, and memory Motor Exam - grossly normal Sensory  Exam - grossly normal Reflexes: 1+ Coordination - grossly normal Gait - grossly normal Balance - grossly normal Cranial Nerves: I: smell Not tested  II: visual acuity  OS: nl    OD: nl  II: visual fields Full to confrontation  II: pupils Equal, round, reactive to light  III,VII: ptosis None  III,IV,VI: extraocular muscles  Full ROM  V: mastication Normal  V: facial light touch sensation  Normal  V,VII: corneal reflex  Present  VII: facial muscle function - upper  Normal  VII: facial muscle function - lower Normal  VIII: hearing Not tested  IX: soft palate elevation  Normal  IX,X: gag reflex Present  XI: trapezius strength  5/5  XI: sternocleidomastoid strength 5/5  XI: neck flexion strength  5/5  XII: tongue strength  Normal    Data Review Lab Results  Component Value Date   WBC 12.3 (H) 12/18/2020   HGB 14.5 12/18/2020   HCT 43.9 12/18/2020   MCV 90.7 12/18/2020   PLT 357 12/18/2020   Lab Results  Component Value Date   NA 138 12/18/2020   K 3.7 12/18/2020   CL 105 12/18/2020   CO2 24 12/18/2020   BUN 12 12/18/2020   CREATININE 0.55 12/18/2020   GLUCOSE 86 12/18/2020   No results found for: INR, PROTIME  Assessment/Plan:  Estimated body mass index is 25.86 kg/m as calculated from the following:   Height as of this encounter: 5\' 3"  (1.6 m).   Weight as of this encounter: 66.2 kg. Patient admitted for posterior lumbar interbody fusion L4-5. Patient has failed a reasonable attempt at conservative therapy.  I explained the condition and procedure to the patient and answered any questions.  Patient wishes to proceed with procedure as planned. Understands risks/ benefits and typical outcomes of procedure.   12/22/2020 12:07 PM

## 2020-12-22 NOTE — Transfer of Care (Signed)
Immediate Anesthesia Transfer of Care Note  Patient: Tammy Summers  Procedure(s) Performed: Posteroir Lumbar Interbody Fusion - Lumbar Four-Lumbar Five (Back)  Patient Location: PACU  Anesthesia Type:General  Level of Consciousness: awake and alert   Airway & Oxygen Therapy: Patient Spontanous Breathing and Patient connected to face mask oxygen  Post-op Assessment: Report given to RN, Post -op Vital signs reviewed and stable and Patient moving all extremities X 4  Post vital signs: Reviewed and stable  Last Vitals:  Vitals Value Taken Time  BP 160/81 12/22/20 1549  Temp    Pulse 73 12/22/20 1551  Resp 17 12/22/20 1551  SpO2 99 % 12/22/20 1551  Vitals shown include unvalidated device data.  Last Pain:  Vitals:   12/22/20 1140  TempSrc:   PainSc: 0-No pain         Complications: No notable events documented.

## 2020-12-22 NOTE — Op Note (Signed)
12/22/2020  3:41 PM  PATIENT:  Tammy Summers  75 y.o. 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 Decompressive lumbar laminectomy Hemi facetectomy foraminotomies L4-5 requiring more work than would be required for a simple exposure of the disk for PLIF in order to adequately decompress the neural elements and address the spinal stenosis 2. Posterior lumbar interbody fusion L4-5 using porous titanium interbody cages packed with morcellized allograft and autograft  3. Posterior fixation L4-5 using Alphatec pedicle screws.  4. Intertransverse arthrodesis L4-5 using morcellized autograft and allograft.  SURGEON:  Marikay Alar, MD  ASSISTANTS: Verlin Dike FNP  ANESTHESIA:  General  EBL: 100 ml  Total I/O In: 1500 [I.V.:1500] Out: 175 [Urine:75; Blood:100]  BLOOD ADMINISTERED:none  DRAINS: none   INDICATION FOR PROCEDURE: This patient presented with recurrent back and leg pain, this time bilateral. Imaging revealed recurrent disc herniation L4-5.  She had had 2 previous microdiscectomies at this level. The patient tried a reasonable attempt at conservative medical measures without relief. I recommended decompression and instrumented fusion to address the stenosis as well as the segmental  instability.  Patient understood the risks, benefits, and alternatives and potential outcomes and wished to proceed.  PROCEDURE DETAILS:  The patient was brought to the operating room. After induction of generalized endotracheal anesthesia the patient was rolled into the prone position on chest rolls and all pressure points were padded. The patient's lumbar region was cleaned and then prepped with DuraPrep and draped in the usual sterile fashion. Anesthesia was injected and then a dorsal midline incision was made and carried down to the lumbosacral fascia. The fascia was  opened and the paraspinous musculature was taken down in a subperiosteal fashion to expose L4-5. A self-retaining retractor was placed. Intraoperative fluoroscopy confirmed my level, and I started with placement of the L4 cortical pedicle screws. The pedicle screw entry zones were identified utilizing surface landmarks and  AP and lateral fluoroscopy. I scored the cortex with the high-speed drill and then used the hand drill to drill an upward and outward direction into the pedicle. I then tapped line to line. I then placed a 6.5 x 40 cortical pedicle screw into the pedicles of L4 bilaterally.  I then turned my attention to the decompression and complete lumbar laminectomies, hemi- facetectomies, and foraminotomies were performed at L4-5.  My nurse practitioner was directly involved in the decompression and exposure of the neural elements. the patient had significant spinal stenosis and this required more work than would be required for a simple exposure of the disc for posterior lumbar interbody fusion which would only require a limited laminotomy. Much more generous decompression and generous foraminotomy was undertaken in order to adequately decompress the neural elements and address the patient's leg pain. The yellow ligament was removed to expose the underlying dura and nerve roots, and generous foraminotomies were performed to adequately decompress the neural elements. Both the exiting and traversing nerve roots were decompressed on both sides until a coronary dilator passed easily along the nerve roots. Once the decompression was complete, I turned my attention to the posterior lower lumbar interbody fusion. The epidural venous vasculature was coagulated and cut sharply. Disc space was incised and the initial discectomy was performed with pituitary rongeurs. The disc space was distracted with sequential distractors to a height of 8 mm. We then used a series of scrapers and shavers to prepare  the endplates for  fusion. The midline was prepared with Epstein curettes. Once the complete discectomy was finished, we packed an appropriate sized interbody cage with local autograft and morcellized allograft, gently retracted the nerve root, and tapped the cage into position at L4-5.  The midline between the cages was packed with morselized autograft and allograft. We then turned our attention to the placement of the lower pedicle screws. The pedicle screw entry zones were identified utilizing surface landmarks and fluoroscopy. I drilled into each pedicle utilizing the hand drill, and tapped each pedicle with the appropriate tap. We palpated with a ball probe to assure no break in the cortex. We then placed 6.5 x 40 mm pedicle screw at L5 on the right and a 5.5 x 40 mm screw on the left.  My nurse practitioner assisted in placement of the pedicle screws.  We then decorticated the transverse processes and laid a mixture of morcellized autograft and allograft out over these to perform intertransverse arthrodesis at L4-5. We then placed lordotic rods into the multiaxial screw heads of the pedicle screws and locked these in position with the locking caps and anti-torque device. We then checked our construct with AP and lateral fluoroscopy. Irrigated with copious amounts of bacitracin-containing saline solution. Inspected the nerve roots once again to assure adequate decompression, lined to the dura with Gelfoam, and then we closed the muscle and the fascia with 0 Vicryl. Closed the subcutaneous tissues with 2-0 Vicryl and subcuticular tissues with 3-0 Vicryl. The skin was closed with benzoin and Steri-Strips. Dressing was then applied, the patient was awakened from general anesthesia and transported to the recovery room in stable condition. At the end of the procedure all sponge, needle and instrument counts were correct.   PLAN OF CARE: admit to inpatient  PATIENT DISPOSITION:  PACU - hemodynamically stable.   Delay start of  Pharmacological VTE agent (>24hrs) due to surgical blood loss or risk of bleeding:  yes

## 2020-12-22 NOTE — Anesthesia Procedure Notes (Signed)
Procedure Name: Intubation Date/Time: 12/22/2020 1:07 PM Performed by: Griffin Dakin, CRNA Pre-anesthesia Checklist: Patient identified, Emergency Drugs available, Suction available and Patient being monitored Patient Re-evaluated:Patient Re-evaluated prior to induction Oxygen Delivery Method: Circle system utilized Preoxygenation: Pre-oxygenation with 100% oxygen Induction Type: IV induction Ventilation: Mask ventilation without difficulty Laryngoscope Size: Mac and 3 Grade View: Grade I Tube type: Oral Tube size: 7.0 mm Number of attempts: 1 Airway Equipment and Method: Stylet Placement Confirmation: ETT inserted through vocal cords under direct vision, positive ETCO2 and breath sounds checked- equal and bilateral Secured at: 22 cm Tube secured with: Tape Dental Injury: Teeth and Oropharynx as per pre-operative assessment

## 2020-12-22 NOTE — Anesthesia Preprocedure Evaluation (Signed)
Anesthesia Evaluation  Patient identified by MRN, date of birth, ID band Patient awake    Reviewed: Allergy & Precautions, NPO status , Patient's Chart, lab work & pertinent test results  Airway Mallampati: III  TM Distance: >3 FB Neck ROM: Full    Dental  (+) Teeth Intact, Dental Advisory Given, Loose,    Pulmonary Current SmokerPatient did not abstain from smoking.,    Pulmonary exam normal breath sounds clear to auscultation       Cardiovascular (-) hypertension(-) angina(-) CAD and (-) Past MI negative cardio ROS Normal cardiovascular exam Rhythm:Regular Rate:Normal     Neuro/Psych Lumbar stenosis     GI/Hepatic Neg liver ROS, GERD  ,  Endo/Other  diabetes, Type 2, Oral Hypoglycemic Agents  Renal/GU Renal InsufficiencyRenal disease     Musculoskeletal negative musculoskeletal ROS (+)   Abdominal   Peds  Hematology negative hematology ROS (+)   Anesthesia Other Findings Day of surgery medications reviewed with the patient.  Reproductive/Obstetrics                             Anesthesia Physical Anesthesia Plan  ASA: 2  Anesthesia Plan: General   Post-op Pain Management:    Induction: Intravenous  PONV Risk Score and Plan: 2 and Dexamethasone and Ondansetron  Airway Management Planned: Oral ETT  Additional Equipment:   Intra-op Plan:   Post-operative Plan: Extubation in OR  Informed Consent: I have reviewed the patients History and Physical, chart, labs and discussed the procedure including the risks, benefits and alternatives for the proposed anesthesia with the patient or authorized representative who has indicated his/her understanding and acceptance.     Dental advisory given  Plan Discussed with: CRNA  Anesthesia Plan Comments:         Anesthesia Quick Evaluation

## 2020-12-23 DIAGNOSIS — M5116 Intervertebral disc disorders with radiculopathy, lumbar region: Secondary | ICD-10-CM | POA: Diagnosis not present

## 2020-12-23 LAB — GLUCOSE, CAPILLARY
Glucose-Capillary: 113 mg/dL — ABNORMAL HIGH (ref 70–99)
Glucose-Capillary: 193 mg/dL — ABNORMAL HIGH (ref 70–99)
Glucose-Capillary: 152 mg/dL — ABNORMAL HIGH (ref 70–99)
Glucose-Capillary: 168 mg/dL — ABNORMAL HIGH (ref 70–99)

## 2020-12-23 MED ORDER — TAPENTADOL HCL 50 MG PO TABS
50.0000 mg | ORAL_TABLET | Freq: Four times a day (QID) | ORAL | Status: DC | PRN
  Administered 2020-12-23 – 2020-12-24 (×4): 50 mg via ORAL
  Filled 2020-12-23 (×4): qty 1

## 2020-12-23 MED ORDER — HYDROXYZINE HCL 50 MG/ML IM SOLN
50.0000 mg | Freq: Four times a day (QID) | INTRAMUSCULAR | Status: DC | PRN
  Administered 2020-12-23: 50 mg via INTRAMUSCULAR
  Filled 2020-12-23: qty 1

## 2020-12-23 MED ORDER — NALOXEGOL OXALATE 12.5 MG PO TABS
12.5000 mg | ORAL_TABLET | Freq: Every day | ORAL | Status: DC
  Administered 2020-12-23 – 2020-12-24 (×2): 12.5 mg via ORAL
  Filled 2020-12-23 (×2): qty 1

## 2020-12-23 NOTE — Evaluation (Signed)
Physical Therapy Evaluation Patient Details Name: Tammy Summers MRN: [id number] DOB: [birth year] Today's Date: 12/23/2020   History of Present Illness  Patient is a 75 y/o female who presents s/p L4-5 PLIF on 12/22/2020. PMH: CKD, DMII, GERD.  Clinical Impression  Pt admitted with above diagnosis. At the time of PT eval, pt was able to demonstrate transfers and ambulation with up to supervision for safety for ambulation and stairs. Pt was educated on precautions, brace application/wearing schedule, appropriate activity progression, and car transfer. Pt currently with functional limitations due to the deficits listed below (see PT Problem List). Pt will benefit from skilled PT to increase their independence and safety with mobility to allow discharge to the venue listed below.      Follow Up Recommendations No PT follow up;Supervision for mobility/OOB    Equipment Recommendations  None recommended by PT    Recommendations for Other Services       Precautions / Restrictions Precautions Precautions: Back Precaution Booklet Issued: Yes (comment) Required Braces or Orthoses: Spinal Brace Spinal Brace: Lumbar corset Restrictions Weight Bearing Restrictions: No      Mobility  Bed Mobility Overal bed mobility: Modified Independent             General bed mobility comments: HOB flat and rails lowered to simulate home environment.    Transfers Overall transfer level: Modified independent Equipment used: None             General transfer comment: Increased time due to pain and nausea  Ambulation/Gait Ambulation/Gait assistance: Supervision Gait Distance (Feet): 150 Feet Assistive device: None Gait Pattern/deviations: Step-through pattern;Decreased stride length;Trunk flexed Gait velocity: Decreased Gait velocity interpretation: <1.31 ft/sec, indicative of household ambulator General Gait Details: VC's for improved posture. Pt appears very guarded amd mildly unsteady,  however no assist was required for her to recover.  Stairs Stairs: Yes Stairs assistance: Supervision Stair Management: One rail Left;Step to pattern;Forwards Number of Stairs: 6 General stair comments: VC's for optimal safety.  Wheelchair Mobility    Modified Rankin (Stroke Patients Only)       Balance Overall balance assessment: No apparent balance deficits (not formally assessed)                                           Pertinent Vitals/Pain Pain Assessment: Faces Faces Pain Scale: Hurts little more Pain Location: back at incision site Pain Descriptors / Indicators: Sore;Operative site guarding Pain Intervention(s): Limited activity within patient's tolerance;Monitored during session;Repositioned    Home Living Family/patient expects to be discharged to:: Private residence Living Arrangements: Spouse/significant other Available Help at Discharge: Family;Available 24 hours/day Type of Home: House Home Access: Stairs to enter Entrance Stairs-Rails: Doctor, general practice of Steps: 6 Home Layout: One level Home Equipment: Hand held shower head;Adaptive equipment      Prior Function Level of Independence: Independent         Comments: Enjoys gardening     Hand Dominance   Dominant Hand: Right    Extremity/Trunk Assessment   Upper Extremity Assessment Upper Extremity Assessment: Defer to OT evaluation    Lower Extremity Assessment Lower Extremity Assessment: Generalized weakness    Cervical / Trunk Assessment Cervical / Trunk Assessment: Other exceptions Cervical / Trunk Exceptions: s/p surgery  Communication   Communication: No difficulties  Cognition Arousal/Alertness: Awake/alert Behavior During Therapy: WFL for tasks assessed/performed Overall Cognitive Status: Within Functional Limits  for tasks assessed                                        General Comments General comments (skin integrity,  edema, etc.): Nauseated    Exercises     Assessment/Plan    PT Assessment Patient needs continued PT services  PT Problem List Decreased strength;Decreased activity tolerance;Decreased balance;Decreased mobility;Decreased knowledge of use of DME;Decreased knowledge of precautions;Decreased safety awareness;Pain       PT Treatment Interventions DME instruction;Gait training;Stair training;Functional mobility training;Therapeutic activities;Therapeutic exercise;Neuromuscular re-education;Patient/family education    PT Goals (Current goals can be found in the Care Plan section)  Acute Rehab PT Goals Patient Stated Goal: go home, get back to gardening when ready PT Goal Formulation: With patient Time For Goal Achievement: 12/30/20 Potential to Achieve Goals: Good    Frequency Min 5X/week   Barriers to discharge        Co-evaluation               AM-PAC PT "6 Clicks" Mobility  Outcome Measure Help needed turning from your back to your side while in a flat bed without using bedrails?: None Help needed moving from lying on your back to sitting on the side of a flat bed without using bedrails?: None Help needed moving to and from a bed to a chair (including a wheelchair)?: None Help needed standing up from a chair using your arms (e.g., wheelchair or bedside chair)?: None Help needed to walk in hospital room?: A Little Help needed climbing 3-5 steps with a railing? : A Little 6 Click Score: 22    End of Session Equipment Utilized During Treatment: Back brace Activity Tolerance: Patient tolerated treatment well Patient left: in bed;with call bell/phone within reach Nurse Communication: Mobility status PT Visit Diagnosis: Unsteadiness on feet (R26.81);Pain Pain - part of body:  (back)    Time: 1694-5038 PT Time Calculation (min) (ACUTE ONLY): 17 min   Charges:   PT Evaluation $PT Eval Low Complexity: 1 Low          [name], PT, DPT Acute Rehabilitation  Services Pager: [phone] Office: [phone]   [name] 12/23/2020, 9:43 AM

## 2020-12-23 NOTE — Progress Notes (Signed)
Subjective: Patient reports N/V this am. No flatus, back mildly sore with some R hip aching, no leg pain or NTW. Has walked.   Objective: Vital signs in last 24 hours: Temp:  [97.2 F (36.2 C)-98 F (36.7 C)] 97.6 F (36.4 C) (07/12 0724) Pulse Rate:  [62-86] 64 (07/12 0724) Resp:  [11-20] 18 (07/12 0724) BP: (118-160)/(55-81) 121/55 (07/12 0724) SpO2:  [91 %-100 %] 95 % (07/12 0724) Weight:  [66.2 kg] 66.2 kg (07/11 1136)  Intake/Output from previous day: 07/11 0701 - 07/12 0700 In: 1500 [I.V.:1500] Out: 175 [Urine:75; Blood:100] Intake/Output this shift: No intake/output data recorded.  Neurologic: Grossly normal  Lab Results: Lab Results  Component Value Date   WBC 12.3 (H) 12/18/2020   HGB 14.5 12/18/2020   HCT 43.9 12/18/2020   MCV 90.7 12/18/2020   PLT 357 12/18/2020   No results found for: INR, PROTIME BMET Lab Results  Component Value Date   NA 138 12/18/2020   K 3.7 12/18/2020   CL 105 12/18/2020   CO2 24 12/18/2020   GLUCOSE 86 12/18/2020   BUN 12 12/18/2020   CREATININE 0.55 12/18/2020   CALCIUM 9.6 12/18/2020    Studies/Results: DG Lumbar Spine 2-3 Views  Result Date: 12/22/2020 CLINICAL DATA:  Surgery, elective. Additional history provided: Posterior lumbar interbody fusion-lumbar 4-lumbar 5. Provided fluoroscopy time 59 seconds (26.27 mGy). EXAM: LUMBAR SPINE - 2-3 VIEW; DG C-ARM 1-60 MIN COMPARISON:  Lumbar spine radiographs 11/11/2020. Lumbar spine MRI 10/23/2020. FINDINGS: AP and lateral view intraoperative fluoroscopic images of the lumbosacral spine are submitted, 2 images total. The lowest well-formed intervertebral disc space is designated L5-S1. On the provided images, a posterior spinal fusion construct is present at L4-L5 (bilateral pedicle screws and vertical interconnecting rods). Interbody device(s) also present at L4-L5. IMPRESSION: Two intraoperative fluoroscopic images of the lumbosacral spine from L4-L5 fusion, as described.  Electronically Signed   By: Jackey Loge DO   On: 12/22/2020 15:59   DG C-Arm 1-60 Min  Result Date: 12/22/2020 CLINICAL DATA:  Surgery, elective. Additional history provided: Posterior lumbar interbody fusion-lumbar 4-lumbar 5. Provided fluoroscopy time 59 seconds (26.27 mGy). EXAM: LUMBAR SPINE - 2-3 VIEW; DG C-ARM 1-60 MIN COMPARISON:  Lumbar spine radiographs 11/11/2020. Lumbar spine MRI 10/23/2020. FINDINGS: AP and lateral view intraoperative fluoroscopic images of the lumbosacral spine are submitted, 2 images total. The lowest well-formed intervertebral disc space is designated L5-S1. On the provided images, a posterior spinal fusion construct is present at L4-L5 (bilateral pedicle screws and vertical interconnecting rods). Interbody device(s) also present at L4-L5. IMPRESSION: Two intraoperative fluoroscopic images of the lumbosacral spine from L4-L5 fusion, as described. Electronically Signed   By: Jackey Loge DO   On: 12/22/2020 15:59    Assessment/Plan: Will change to clear liquids and change pain meds. Await flatus. Mobilize as tolerated  Estimated body mass index is 25.86 kg/m as calculated from the following:   Height as of this encounter: 5\' 3"  (1.6 m).   Weight as of this encounter: 66.2 kg.    LOS: 0 days    12/23/2020, 8:04 AM

## 2020-12-23 NOTE — Evaluation (Signed)
Occupational Therapy Evaluation/Discharge Patient Details Name: Tammy Summers MRN: [id number] DOB: Feb 14, 1946 Today's Date: 12/23/2020    History of Present Illness Patient is a 75 y.o. female with progressive back and leg pain. MRI showed recurrent disc herniation at L4-5. Pt elected to undergo L4-5 fusion. PMH: CKD, DMII, GERD.   Clinical Impression   PTA, pt lives with spouse and reports Independence in all daily tasks without AD. Pt presents now with minor postoperative pain. Educated on spinal precautions with ADLs, brace mgmt and optimal body mechanics during ADLs/IADLs. Pt able to demo ADLs and transfers independently after education. Encouraged use of shower chair or BSC during bathing tasks to maintain back precautions. Pt verbalized understanding of all education. Further mobility/ADLs somewhat limited due to bouts of nausea/vomiting. Anticipate no further OT needs at acute level or on DC. OT to sign off.     Follow Up Recommendations  No OT follow up    Equipment Recommendations  3 in 1 bedside commode    Recommendations for Other Services       Precautions / Restrictions Precautions Precautions: Back Precaution Booklet Issued: Yes (comment) Required Braces or Orthoses: Spinal Brace Spinal Brace: Lumbar corset Restrictions Weight Bearing Restrictions: No      Mobility Bed Mobility Overal bed mobility: Modified Independent             General bed mobility comments: able to demo log rolling after education    Transfers Overall transfer level: Independent Equipment used: None                  Balance Overall balance assessment: No apparent balance deficits (not formally assessed)                                         ADL either performed or assessed with clinical judgement   ADL Overall ADL's : Modified independent                                       General ADL Comments: After education on spinal  precautions, able to don clothing using problem solving and compensatory strategies without assistance. Pt able to manage brace     Vision Patient Visual Report: No change from baseline Vision Assessment?: No apparent visual deficits     Perception     Praxis      Pertinent Vitals/Pain Pain Assessment: Faces Faces Pain Scale: Hurts a little bit Pain Location: back at incision site Pain Descriptors / Indicators: Sore;Grimacing Pain Intervention(s): Monitored during session     Hand Dominance Right   Extremity/Trunk Assessment Upper Extremity Assessment Upper Extremity Assessment: Overall WFL for tasks assessed   Lower Extremity Assessment Lower Extremity Assessment: Defer to PT evaluation   Cervical / Trunk Assessment Cervical / Trunk Assessment: Normal   Communication Communication Communication: No difficulties   Cognition Arousal/Alertness: Awake/alert Behavior During Therapy: WFL for tasks assessed/performed Overall Cognitive Status: Within Functional Limits for tasks assessed                                     General Comments  Pt with nausea and vomiting during session - RN aware    Exercises     Shoulder Instructions  Home Living Family/patient expects to be discharged to:: Private residence Living Arrangements: Spouse/significant other Available Help at Discharge: Family;Available 24 hours/day Type of Home: House Home Access: Stairs to enter [employer] of Steps: 6 Entrance Stairs-Rails: Right;Left Home Layout: One level     Bathroom Shower/Tub: Chief Strategy Officer: Standard     Home Equipment: Hand held shower head;Adaptive equipment Adaptive Equipment: Reacher        Prior Functioning/Environment Level of Independence: Independent        Comments: Enjoys gardening        OT Problem List:        OT Treatment/Interventions:      OT Goals(Current goals can be found in the care plan  section) Acute Rehab OT Goals Patient Stated Goal: go home, get back to gardening when ready OT Goal Formulation: All assessment and education complete, DC therapy  OT Frequency:     Barriers to D/C:            Co-evaluation              AM-PAC OT "6 Clicks" Daily Activity     Outcome Measure Help from another person eating meals?: None Help from another person taking care of personal grooming?: None Help from another person toileting, which includes using toliet, bedpan, or urinal?: None Help from another person bathing (including washing, rinsing, drying)?: None Help from another person to put on and taking off regular upper body clothing?: None Help from another person to put on and taking off regular lower body clothing?: None 6 Click Score: 24   End of Session Nurse Communication: Mobility status  Activity Tolerance: Patient tolerated treatment well;Other (comment) (limited by nausea) Patient left: in bed;with call bell/phone within reach  OT Visit Diagnosis: Pain Pain - part of body:  (back)                Time: 7654-6503 OT Time Calculation (min): 30 min Charges:  OT General Charges $OT Visit: 1 Visit OT Evaluation $OT Eval Low Complexity: 1 Low OT Treatments $Self Care/Home Management : 8-22 mins  Bradd Canary, OTR/L Acute Rehab Services Office: [phone]   Lorre Munroe 12/23/2020, 8:11 AM

## 2020-12-23 NOTE — Anesthesia Postprocedure Evaluation (Signed)
Anesthesia Post Note  Patient: Tammy Summers  Procedure(s) Performed: Posteroir Lumbar Interbody Fusion - Lumbar Four-Lumbar Five (Back)     Patient location during evaluation: PACU Anesthesia Type: General Level of consciousness: awake and alert Pain management: pain level controlled Vital Signs Assessment: post-procedure vital signs reviewed and stable Respiratory status: spontaneous breathing, nonlabored ventilation, respiratory function stable and patient connected to nasal cannula oxygen Cardiovascular status: blood pressure returned to baseline and stable Postop Assessment: no apparent nausea or vomiting Anesthetic complications: no   No notable events documented.  Last Vitals:  Vitals:   12/23/20 0441 12/23/20 0724  BP: 128/67 (!) 121/55  Pulse: 68 64  Resp: 18 18  Temp: 36.6 C 36.4 C  SpO2: 96% 95%    Last Pain:  Vitals:   12/23/20 0724  TempSrc: Oral  PainSc:                  Cecile Hearing

## 2020-12-24 DIAGNOSIS — M5116 Intervertebral disc disorders with radiculopathy, lumbar region: Secondary | ICD-10-CM | POA: Diagnosis not present

## 2020-12-24 LAB — GLUCOSE, CAPILLARY: Glucose-Capillary: 129 mg/dL — ABNORMAL HIGH (ref 70–99)

## 2020-12-24 MED ORDER — METHOCARBAMOL 500 MG PO TABS: 500.0000 mg | ORAL_TABLET | Freq: Four times a day (QID) | ORAL | 1 refills | Status: AC | PRN

## 2020-12-24 MED ORDER — HYDROCODONE-ACETAMINOPHEN 5-325 MG PO TABS: 1.0000 | ORAL_TABLET | ORAL | 0 refills | Status: AC | PRN

## 2020-12-24 NOTE — Discharge Summary (Signed)
Physician Discharge Summary  Patient ID: Tammy Summers MRN: [id number] DOB/AGE: 1945/07/10 75 y.o.  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 The patient tolerated the procedure well and was taken to the recovery room and then to the floor in stable condition. The hospital course was routine. There were no complications. The wound remained clean dry and intact. Pt had appropriate back soreness. No complaints of leg pain or new N/T/W. The patient remained afebrile with stable vital signs, and tolerated a regular diet. The patient continued to increase activities, and pain was well controlled with oral pain medications.   Consults: None  Significant Diagnostic Studies:  Results for orders placed or performed during the hospital encounter of 12/22/20  Glucose, capillary  Result Value Ref Range   Glucose-Capillary 127 (H) 70 - 99 mg/dL  Glucose, capillary  Result Value Ref Range   Glucose-Capillary 104 (H) 70 - 99 mg/dL  Glucose, capillary  Result Value Ref Range   Glucose-Capillary 332 (H) 70 - 99 mg/dL   Comment 1 Notify RN    Comment 2 Document in Chart   Glucose, capillary  Result Value Ref Range   Glucose-Capillary 113 (H) 70 - 99 mg/dL   Comment 1 Notify RN    Comment 2 Document in Chart   Glucose, capillary  Result Value Ref Range   Glucose-Capillary 193 (H) 70 - 99 mg/dL  Glucose, capillary  Result Value Ref Range   Glucose-Capillary 152 (H) 70 - 99 mg/dL  Glucose, capillary  Result Value Ref Range   Glucose-Capillary 168 (H) 70 - 99 mg/dL   Comment 1 Notify RN    Comment 2 Document in Chart   Glucose, capillary  Result Value Ref Range   Glucose-Capillary 129 (H) 70 - 99 mg/dL   Comment 1 Notify RN    Comment 2  Document in Chart   ABO/Rh  Result Value Ref Range   ABO/RH(D)      A POS Performed at [facility], [address] [place], [place] [address]     DG Lumbar Spine 2-3 Views  Result Date: 12/22/2020 CLINICAL DATA:  Surgery, elective. Additional history provided: Posterior lumbar interbody fusion-lumbar 4-lumbar 5. Provided fluoroscopy time 59 seconds (26.27 mGy). EXAM: LUMBAR SPINE - 2-3 VIEW; DG C-ARM 1-60 MIN COMPARISON:  Lumbar spine radiographs 11/11/2020. Lumbar spine MRI 10/23/2020. FINDINGS: AP and lateral view intraoperative fluoroscopic images of the lumbosacral spine are submitted, 2 images total. The lowest well-formed intervertebral disc space is designated L5-S1. On the provided images, a posterior spinal fusion construct is present at L4-L5 (bilateral pedicle screws and vertical interconnecting rods). Interbody device(s) also present at L4-L5. IMPRESSION: Two intraoperative fluoroscopic images of the lumbosacral spine from L4-L5 fusion, as described. Electronically Signed   By: Jackey Loge DO   On: 12/22/2020 15:59   DG C-Arm 1-60 Min  Result Date: 12/22/2020 CLINICAL DATA:  Surgery, elective. Additional history provided: Posterior lumbar interbody fusion-lumbar 4-lumbar 5. Provided fluoroscopy time 59 seconds (26.27 mGy). EXAM: LUMBAR SPINE - 2-3 VIEW; DG C-ARM 1-60 MIN COMPARISON:  Lumbar spine radiographs 11/11/2020. Lumbar spine MRI 10/23/2020. FINDINGS: AP and lateral view intraoperative fluoroscopic images of the lumbosacral spine are submitted, 2 images total. The lowest well-formed intervertebral disc space is designated L5-S1. On the  provided images, a posterior spinal fusion construct is present at L4-L5 (bilateral pedicle screws and vertical interconnecting rods). Interbody device(s) also present at L4-L5. IMPRESSION: Two intraoperative fluoroscopic images of the lumbosacral spine from L4-L5 fusion, as described. Electronically Signed   By: Jackey Loge DO   On:  12/22/2020 15:59    Antibiotics:  Anti-infectives (From admission, onward)    Start     Dose/Rate Route Frequency Ordered Stop   12/22/20 2130  ceFAZolin (ANCEF) IVPB 2g/100 mL premix        2 g 200 mL/hr over 30 Minutes Intravenous Every 8 hours 12/22/20 1715 12/23/20 0459       Discharge Exam: Blood pressure 137/65, pulse 64, temperature 98 F (36.7 C), temperature source Oral, resp. rate 18, height 5\' 3"  (1.6 m), weight 66.2 kg, SpO2 96 %. Neurologic: Grossly normal Dressing dry  Discharge Medications:   Allergies as of 12/24/2020       Reactions   Latex Itching   Levaquin [levofloxacin] Rash   Made skin burn        Medication List     TAKE these medications    aspirin EC 81 MG tablet Take 81 mg by mouth daily. Swallow whole.   brimonidine-timolol 0.2-0.5 % ophthalmic solution Commonly known as: COMBIGAN Apply 1 drop to eye 2 (two) times daily.   cholecalciferol 25 MCG (1000 UNIT) tablet Commonly known as: VITAMIN D3 Take 1,000 Units by mouth daily.   Vitamin D3 10 MCG (400 UNIT) tablet Take 400 Units by mouth daily.   estradiol 0.05 mg/24hr patch Commonly known as: CLIMARA - Dosed in mg/24 hr Place 0.05 mg onto the skin once a week.   glipiZIDE 10 MG 24 hr tablet Commonly known as: GLUCOTROL XL Take 20 mg by mouth every morning.   HYDROcodone-acetaminophen 5-325 MG tablet Commonly known as: NORCO/VICODIN Take 1 tablet by mouth every 4 (four) hours as needed for moderate pain.   loratadine-pseudoephedrine 5-120 MG tablet Commonly known as: CLARITIN-D 12-hour Take 1 tablet by mouth daily as needed for allergies.   Lumigan 0.01 % Soln Generic drug: bimatoprost Place 1 drop into both eyes at bedtime.   meclizine 25 MG tablet Commonly known as: ANTIVERT Take 25 mg by mouth 3 (three) times daily as needed (inner ear problems).   metFORMIN 500 MG 24 hr tablet Commonly known as: GLUCOPHAGE-XR Take 500 mg by mouth 2 (two) times daily.    methocarbamol 500 MG tablet Commonly known as: ROBAXIN Take 1 tablet (500 mg total) by mouth every 6 (six) hours as needed for muscle spasms.   pravastatin 80 MG tablet Commonly known as: PRAVACHOL Take 80 mg by mouth daily.   vitamin B-12 1000 MCG tablet Commonly known as: CYANOCOBALAMIN Take 1,000 mcg by mouth daily.               Durable Medical Equipment  (From admission, onward)           Start     Ordered   12/22/20 1756  DME Walker rolling  Once       Question:  Patient needs a walker to treat with the following condition  Answer:  S/P lumbar fusion   12/22/20 1755   12/22/20 1756  DME 3 n 1  Once        12/22/20 1755            Disposition: home   Final Dx: PLIF L4-5  Discharge Instructions     Call MD for:  difficulty breathing, headache or visual disturbances   Complete by: As directed    Call MD for:  persistant nausea and vomiting   Complete by: As directed    Call MD for:  redness, tenderness, or signs of infection (pain, swelling, redness, odor or green/yellow discharge around incision site)   Complete by: As directed    Call MD for:  severe uncontrolled pain   Complete by: As directed    Call MD for:  temperature >100.4   Complete by: As directed    Diet - low sodium heart healthy   Complete by: As directed    Increase activity slowly   Complete by: As directed    Remove dressing in 48 hours   Complete by: As directed           Signed: Tia Alert 12/24/2020, 8:58 AM

## 2020-12-24 NOTE — Progress Notes (Signed)
Patient alert and oriented, voiding adequately, MAE well with no difficulty. Incision area cdi with no s/s of infection. Patient discharged home per order. Patient stated understanding of discharge instructions given. Patient has an appointment with Dr. Yetta Barre.

## 2020-12-24 NOTE — Progress Notes (Signed)
Physical Therapy Treatment Patient Details Name: Tammy Summers MRN: [id number] DOB: May 19, 1946 Today's Date: 12/24/2020    History of Present Illness Patient is a 75 y/o female who presents s/p L4-5 PLIF on 12/22/2020. PMH: CKD, DMII, GERD.    PT Comments    Pt progressing well with post-op mobility. She was able to demonstrate transfers and ambulation with gross modified independence and no AD. Reinforced education on precautions, brace application/wearing schedule, appropriate activity progression, and car transfer. Will continue to follow.      Follow Up Recommendations  No PT follow up;Supervision for mobility/OOB     Equipment Recommendations  None recommended by PT    Recommendations for Other Services       Precautions / Restrictions Precautions Precautions: Back Precaution Booklet Issued: Yes (comment) Required Braces or Orthoses: Spinal Brace Spinal Brace: Lumbar corset Restrictions Weight Bearing Restrictions: No    Mobility  Bed Mobility Overal bed mobility: Modified Independent             General bed mobility comments: VC's for optimal postioning and posture.    Transfers Overall transfer level: Modified independent Equipment used: None             General transfer comment: No assist to power-up to full stand. No unsteadiness noted.  Ambulation/Gait Ambulation/Gait assistance: Modified independent (Device/Increase time) Gait Distance (Feet): 300 Feet Assistive device: None Gait Pattern/deviations: Step-through pattern;Decreased stride length;Trunk flexed Gait velocity: Decreased Gait velocity interpretation: 1.31 - 2.62 ft/sec, indicative of limited community ambulator General Gait Details: Slow and mildly guarded but overall without difficulty.   Stairs             Wheelchair Mobility    Modified Rankin (Stroke Patients Only)       Balance Overall balance assessment: No apparent balance deficits (not formally assessed)                                           Cognition Arousal/Alertness: Awake/alert Behavior During Therapy: WFL for tasks assessed/performed Overall Cognitive Status: Within Functional Limits for tasks assessed                                        Exercises      General Comments        Pertinent Vitals/Pain Pain Assessment: Faces Faces Pain Scale: Hurts little more Pain Location: back at incision site Pain Descriptors / Indicators: Sore;Operative site guarding Pain Intervention(s): Limited activity within patient's tolerance;Monitored during session;Repositioned    Home Living                      Prior Function            PT Goals (current goals can now be found in the care plan section) Acute Rehab PT Goals Patient Stated Goal: go home, get back to gardening when ready PT Goal Formulation: With patient Time For Goal Achievement: 12/30/20 Potential to Achieve Goals: Good Progress towards PT goals: Progressing toward goals    Frequency    Min 5X/week      PT Plan Current plan remains appropriate    Co-evaluation              AM-PAC PT "6 Clicks" Mobility   Outcome Measure  Help needed turning  from your back to your side while in a flat bed without using bedrails?: None Help needed moving from lying on your back to sitting on the side of a flat bed without using bedrails?: None Help needed moving to and from a bed to a chair (including a wheelchair)?: None Help needed standing up from a chair using your arms (e.g., wheelchair or bedside chair)?: None Help needed to walk in hospital room?: None Help needed climbing 3-5 steps with a railing? : A Little 6 Click Score: 23    End of Session Equipment Utilized During Treatment: Back brace Activity Tolerance: Patient tolerated treatment well Patient left: in bed;with call bell/phone within reach Nurse Communication: Mobility status PT Visit Diagnosis:  Unsteadiness on feet (R26.81);Pain Pain - part of body:  (back)     Time: 0820-0830 PT Time Calculation (min) (ACUTE ONLY): 10 min  Charges:  $Gait Training: 8-22 mins                     [name], PT, DPT Acute Rehabilitation Services Pager: [phone] Office: [phone]    [name] 12/24/2020, 9:29 AM

## 2020-12-24 NOTE — Discharge Instructions (Signed)

## 2021-01-12 ENCOUNTER — Other Ambulatory Visit: Payer: Self-pay | Admitting: Student

## 2021-01-12 DIAGNOSIS — M5416 Radiculopathy, lumbar region: Secondary | ICD-10-CM

## 2021-01-16 ENCOUNTER — Ambulatory Visit
Admission: RE | Admit: 2021-01-16 | Discharge: 2021-01-16 | Disposition: A | Discharge status: Home / Self Care | Payer: Medicare Other | Source: Ambulatory Visit | Attending: Student | Admitting: Student

## 2021-01-16 DIAGNOSIS — M5416 Radiculopathy, lumbar region: Secondary | ICD-10-CM

## 2021-04-14 ENCOUNTER — Other Ambulatory Visit: Payer: Self-pay | Admitting: Student

## 2021-04-14 DIAGNOSIS — M5416 Radiculopathy, lumbar region: Secondary | ICD-10-CM

## 2021-05-14 ENCOUNTER — Ambulatory Visit
Admission: RE | Admit: 2021-05-14 | Discharge: 2021-05-14 | Disposition: A | Discharge status: Home / Self Care | Payer: Medicare Other | Source: Ambulatory Visit | Attending: Student | Admitting: Student

## 2021-05-14 DIAGNOSIS — M5416 Radiculopathy, lumbar region: Secondary | ICD-10-CM

## 2021-09-06 IMAGING — CT CT L SPINE W/O CM
3 of 4 series · 11 of 33 positions shown, 13 images · non-contrast
Comparison: CT 05/01/2018, MRI 10/23/2020

CLINICAL DATA: Radiculopathy, 3 weeks of leg pain, lumbar surgery
12/22/2020, MR lumbar spine 03/22/2015

EXAM:
CT LUMBAR SPINE WITHOUT CONTRAST
TECHNIQUE: Multidetector CT imaging of the lumbar spine was performed without
intravenous contrast administration. Multiplanar CT image
reconstructions were also generated.

[Series 3: l-spine 2.00 br40 s3 (person_name) · axial · 0.31mm/px · z∈[+1238,+1396]mm · 3 of 119 slices shown, 4 images]
[im 20/119  soft-tissue]
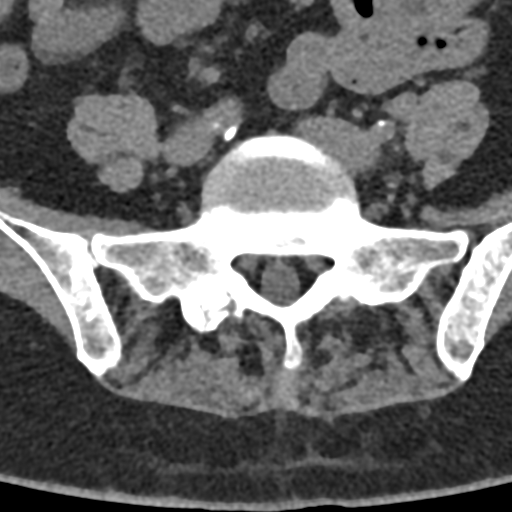
[im 20/119  bone]
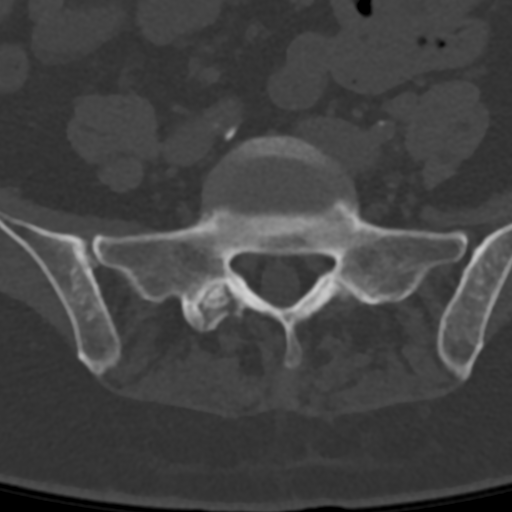
[im 60/119  bone]
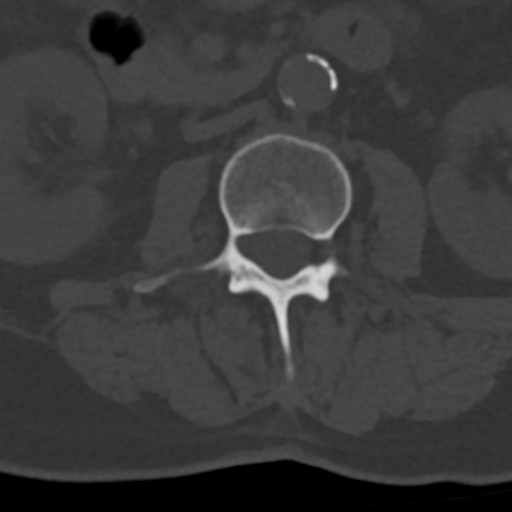
[im 99/119  bone]
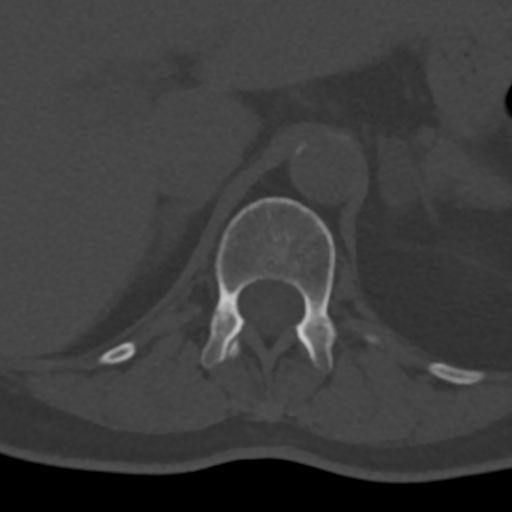

[Series 7: l-spine 2.00 br44 s3 sag soft · sagittal · 0.31mm/px · 5 of 73 slices shown, 6 images]
[im 25/73  bone]
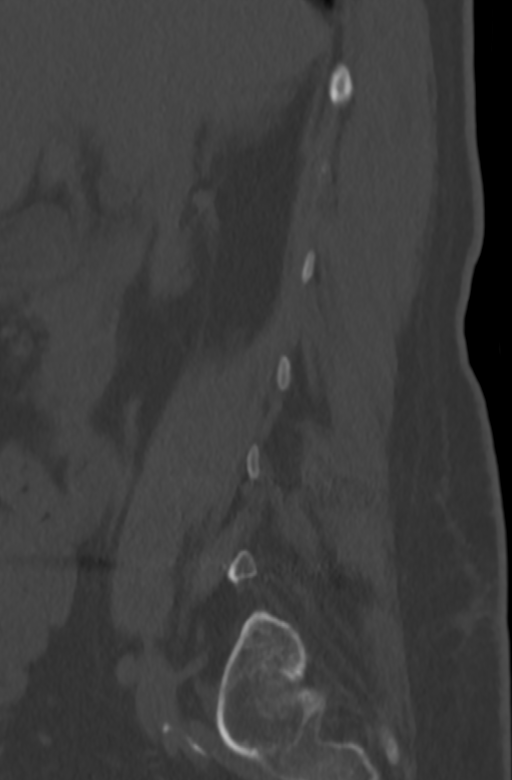
[im 31/73  bone]
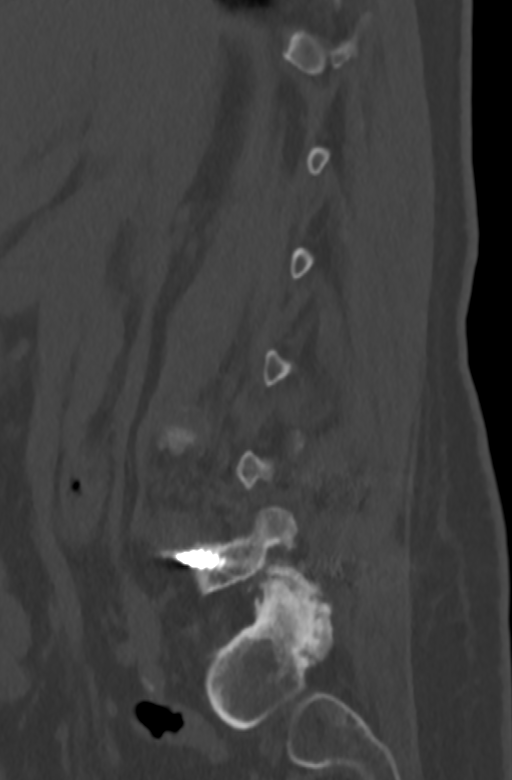
[im 37/73  soft-tissue]
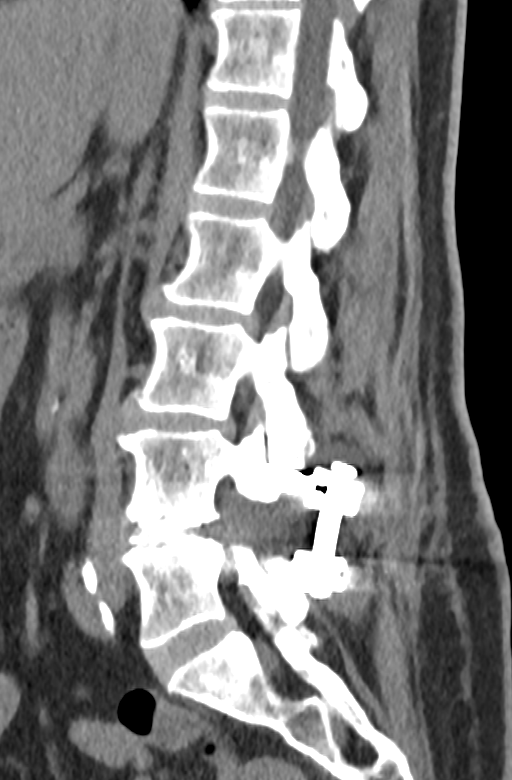
[im 37/73  bone]
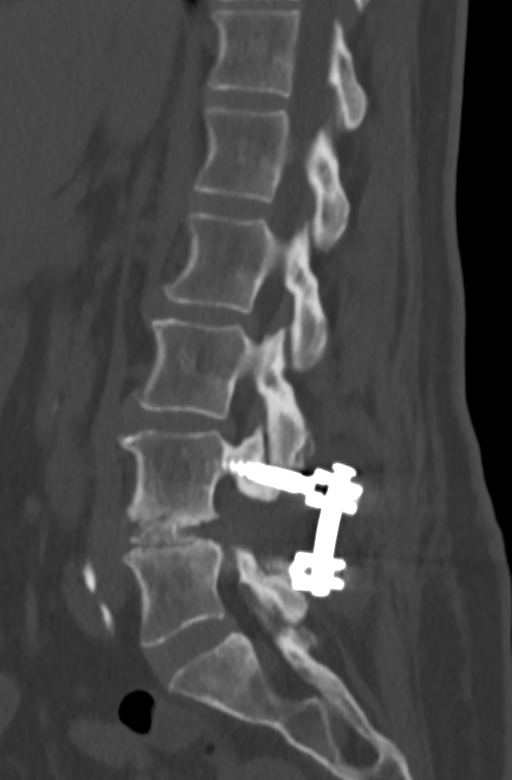
[im 43/73  bone]
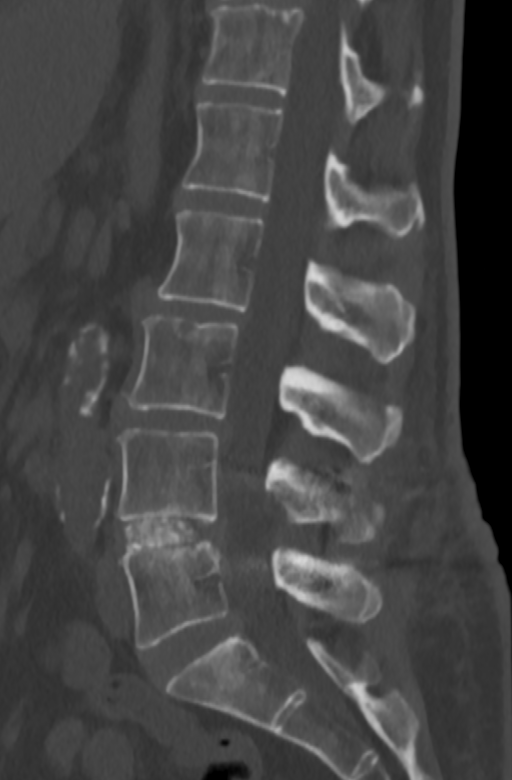
[im 49/73  bone]
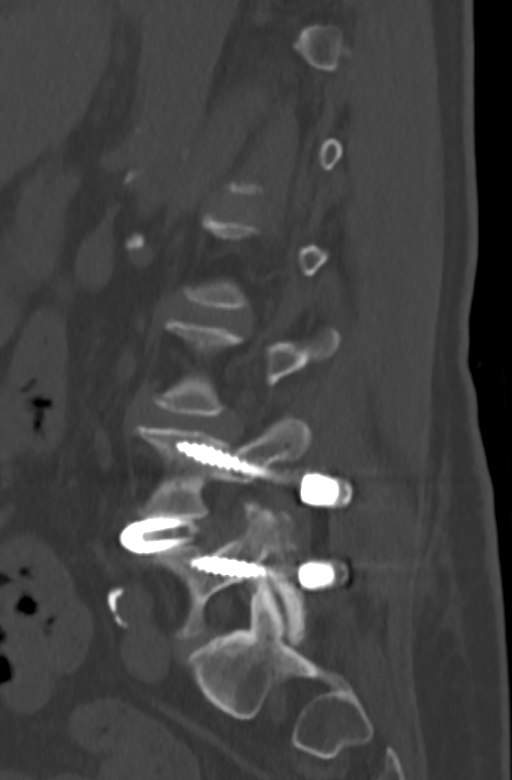

[Series 9: l-spine 2.00 br60 s3 cor bone · coronal · 0.31mm/px · 3 of 78 slices shown]
[im 16/78  bone]
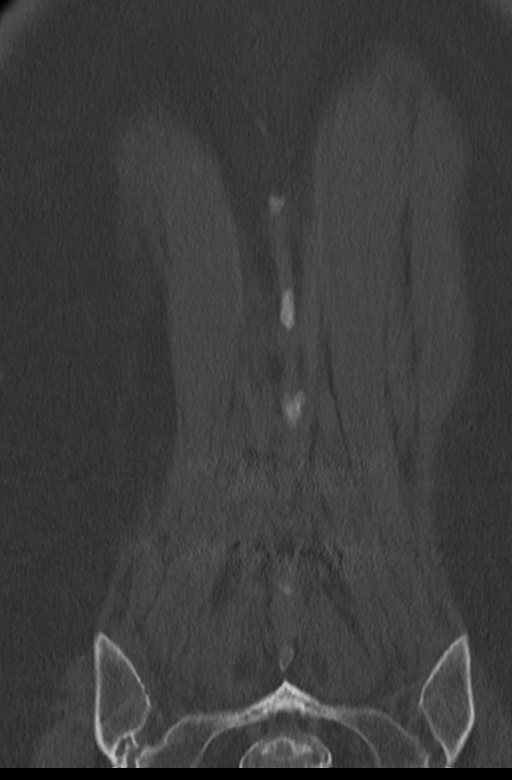
[im 31/78  bone]
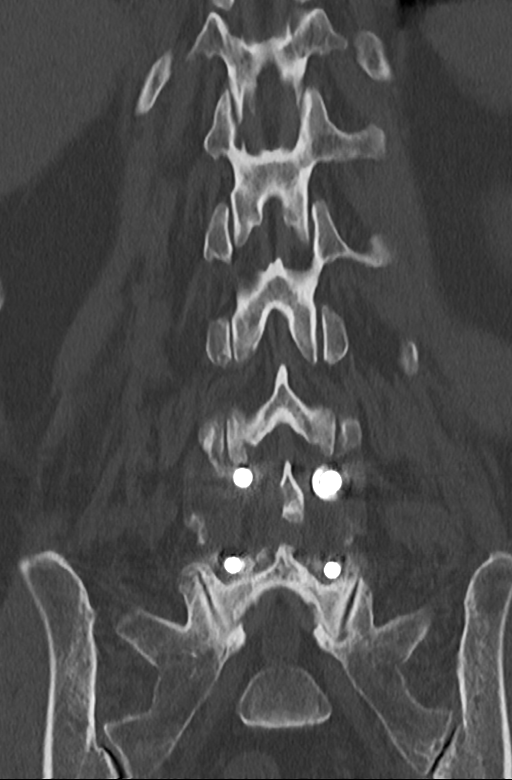
[im 47/78  bone]
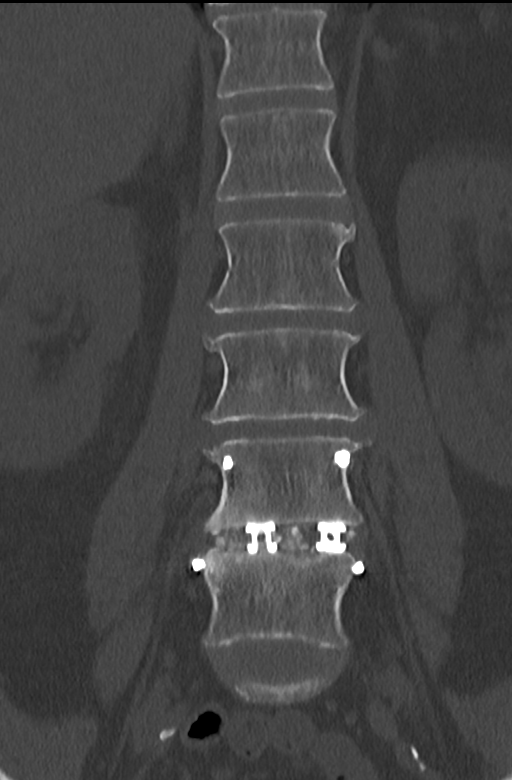

[11 of 33 positions shown; findings below may reference images not displayed]

FINDINGS: Segmentation: 5 normally formed lumbar vertebrae. Lowest fully
formed disc space denoted as L5-S1.

Alignment: Preservation of the lumbar lordosis. Fusion of the L4-5
levels.

Vertebrae: Postsurgical changes from L4-5 posterior decompression,
interbody spacer and bone graft placement with posterior
transpedicular screws and fusion. Decompressive changes include L4-5
laminectomy and medial hemifacetectomy. While the hardware appears
intact, there are fracture lines extending inferiorly from the right
L4 transpedicular screw and superiorly through the right L5
transpedicular screws. No other acute complication. Interbody spacer
with bone graft material. No subsidence. No other acute or
conspicuous osseous abnormality.

Paraspinal and other soft tissues: Expected postsurgical changes of
the soft tissues posteriorly. Included portions of the abdomen and
pelvis reveal aortoiliac atherosclerosis. Nonspecific calcification
involving the superior pole right kidney, may reflect sequela prior
infection or inflammation versus ruptured cyst. Stable bilateral
low-attenuation adrenal nodules, probable adrenal adenomata.

Disc levels:

Level by level evaluation of the lumbar spine below:

T12-L1: No significant spinal canal stenosis or foraminal narrowing.

L1-L2: No significant spinal canal stenosis or foraminal narrowing.

L2-L3: Minimal disc height loss and facet arthropathy. No
significant posterior disc abnormality. No significant spinal canal
or foraminal stenosis.

L3-L4: Asymmetric global disc bulge eccentric to the left central to
extraforaminal zone. Mild canal stenosis. Partial effacement of the
left lateral recess and mild left foraminal narrowing. No
significant right foraminal stenosis.

L4-L5: Posterior and lateral decompressive changes as described
above. No significant residual canal or foraminal stenosis within
limitations of CT imaging.

L5-S1: Shallow global disc bulge and mild facet arthropathy. No
significant spinal canal or foraminal stenosis.
IMPRESSION: 1. Postsurgical changes from posterior and lateral decompression and
PLIF L4-5.
2. Nondisplaced fractures involving the transpedicular screws on the
right L4 and L5.
3. No significant residual canal or foraminal stenosis within the
limitations of unenhanced CT imaging.
4. No other acute or worrisome postsurgical complication.
5. Mild residual canal stenosis and effacement the left lateral
recess with left foraminal narrowing at the adjacent L3-L4 level.
6.  Aortic Atherosclerosis ([id number]).
7. Stable adrenal adenomata.
8. Calcification in the upper pole right kidney, possible sequela of
prior infection, inflammation or collapsing cyst. Could correlate
with outpatient nonemergent renal ultrasound as warranted.
